# Patient Record
Sex: Female | Born: 1985 | Race: Black or African American | Hispanic: No | Marital: Single | Smoking: Current every day smoker
Health system: Southern US, Community
[De-identification: ages and names within clinical notes are randomized; demographics above are authoritative.]

## PROBLEM LIST (undated history)

## (undated) DIAGNOSIS — I10 Essential (primary) hypertension: Secondary | ICD-10-CM

## (undated) DIAGNOSIS — I890 Lymphedema, not elsewhere classified: Secondary | ICD-10-CM

## (undated) DIAGNOSIS — J45909 Unspecified asthma, uncomplicated: Secondary | ICD-10-CM

## (undated) HISTORY — PX: KIDNEY SURGERY: SHX687

---

## 2006-12-01 ENCOUNTER — Emergency Department: Payer: Self-pay | Admitting: Emergency Medicine

## 2006-12-03 ENCOUNTER — Emergency Department: Payer: Self-pay | Admitting: Emergency Medicine

## 2007-10-26 ENCOUNTER — Emergency Department: Payer: Self-pay | Admitting: Emergency Medicine

## 2007-10-30 ENCOUNTER — Emergency Department: Payer: Self-pay | Admitting: Emergency Medicine

## 2007-11-26 ENCOUNTER — Emergency Department: Payer: Self-pay | Admitting: Emergency Medicine

## 2008-03-31 ENCOUNTER — Emergency Department: Payer: Self-pay | Admitting: Emergency Medicine

## 2008-11-22 ENCOUNTER — Emergency Department (HOSPITAL_COMMUNITY): Admission: EM | Admit: 2008-11-22 | Discharge: 2008-11-23 | Payer: Self-pay | Admitting: Emergency Medicine

## 2009-01-25 ENCOUNTER — Emergency Department: Payer: Self-pay | Admitting: Emergency Medicine

## 2009-03-31 IMAGING — CR DG CHEST 2V
1 series · 2 of 2 positions shown · non-contrast
Comparison: none

REASON FOR EXAM: cough,fever
COMMENTS:

PROCEDURE:     DXR - DXR CHEST PA (OR AP) AND LATERAL  - March 31, 2008 [DATE]
RESULT:     Comparison: No comparison

[Series 1: view not recorded · 0.17mm/px · 2 of 2 slices shown]
[im 1/2]
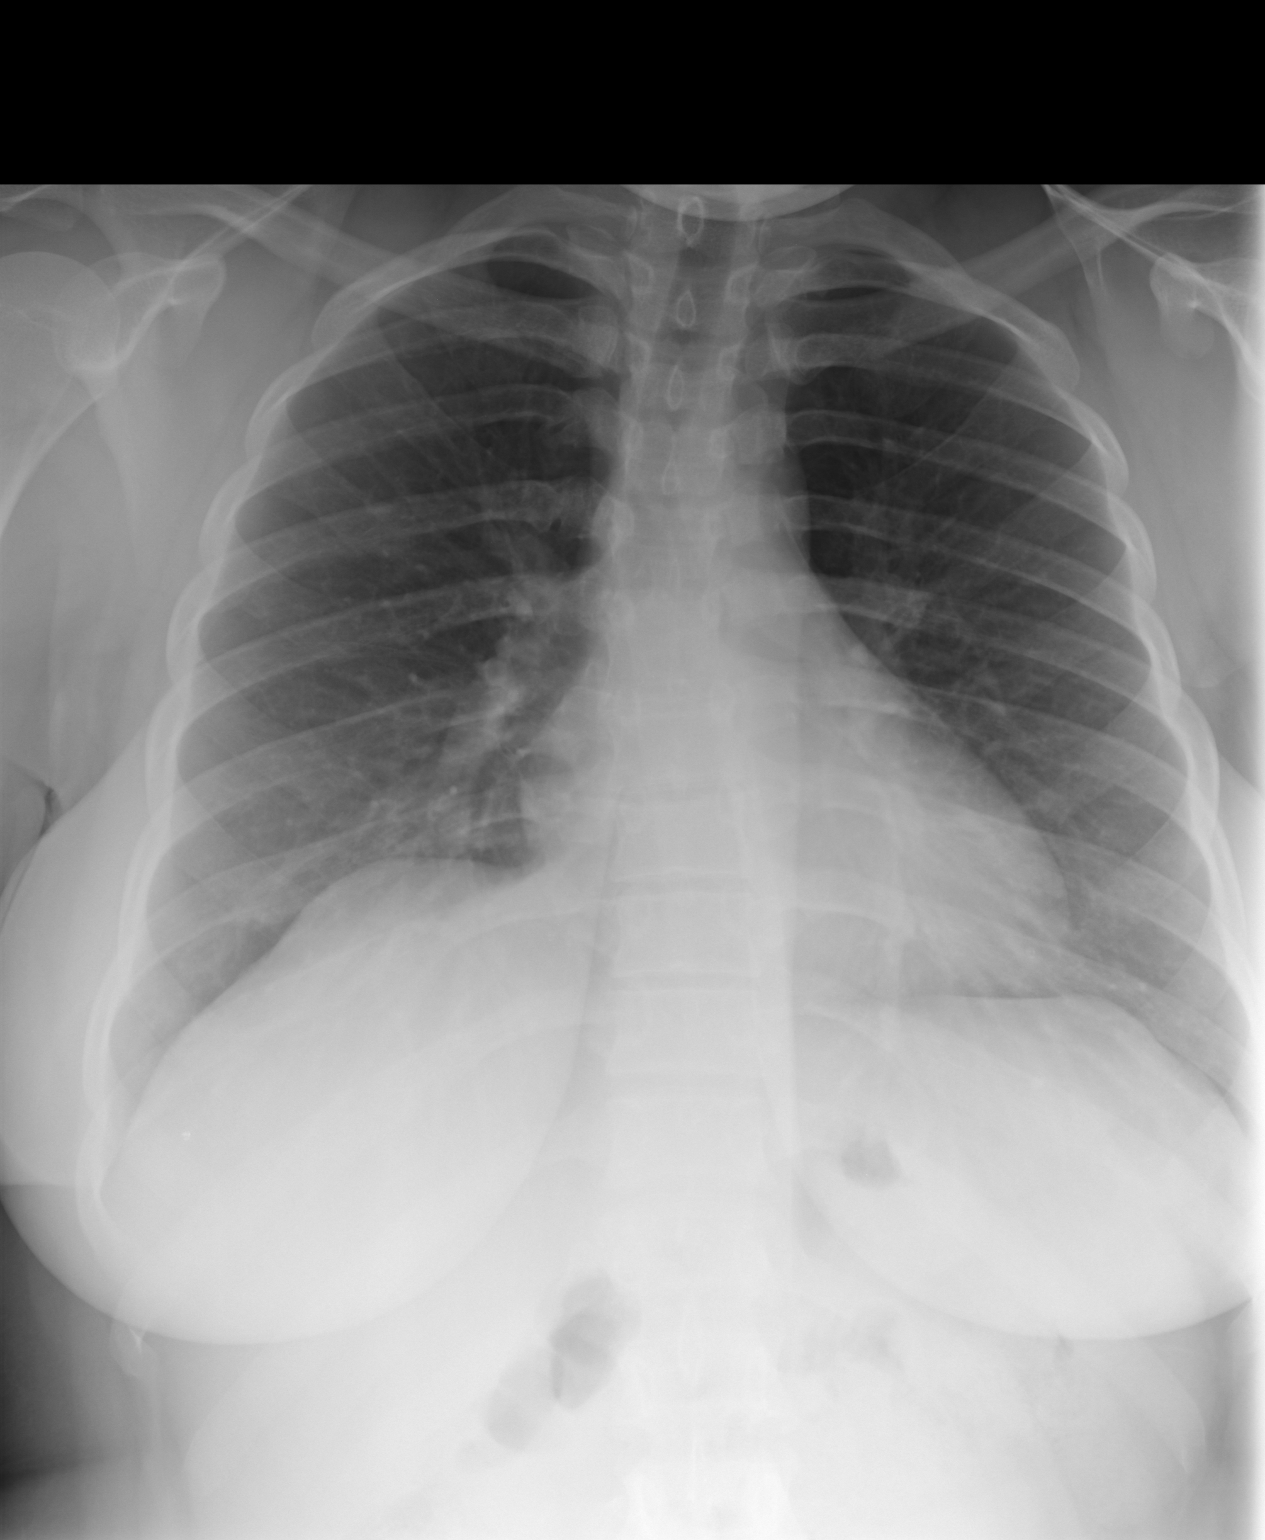
[im 2/2]
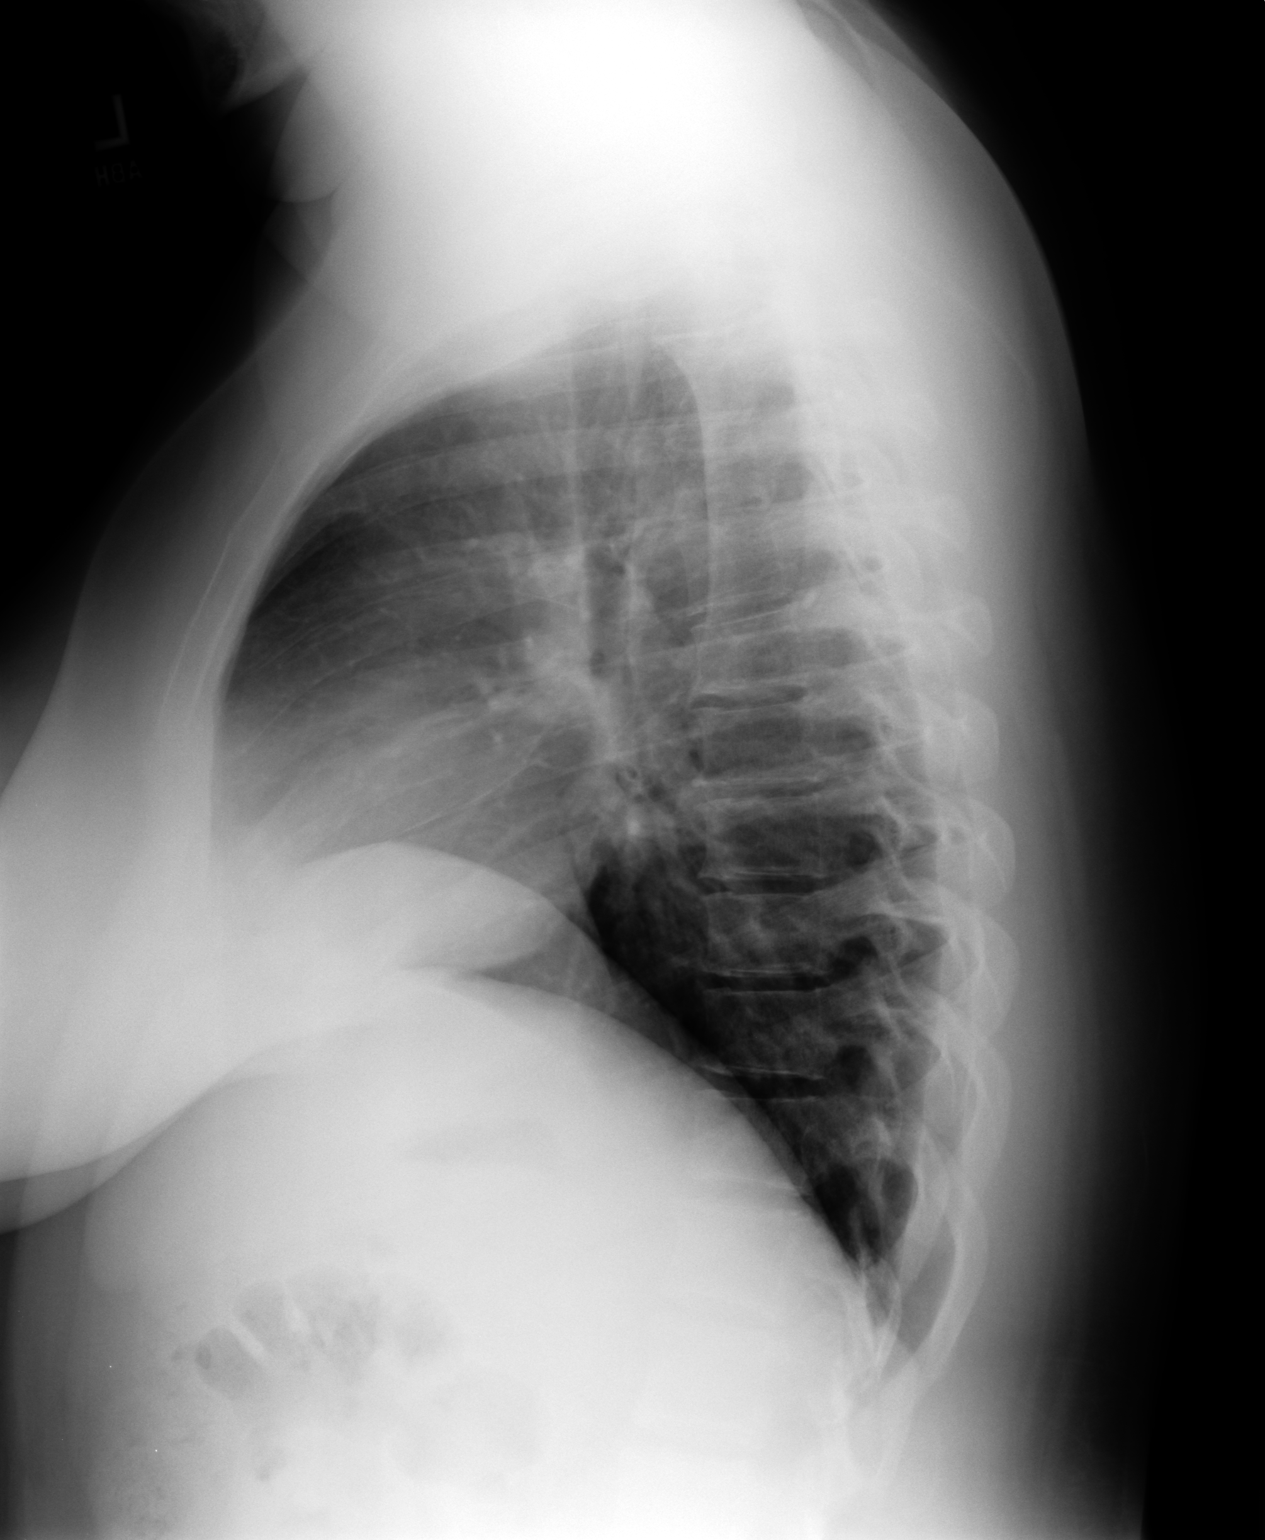

[2 of 2 positions shown; findings below may reference images not displayed]

FINDINGS: PA and lateral chest radiographs are provided. There is no focal parenchymal
opacity, pleural effusion, or pneumothorax. The heart and mediastinum are
unremarkable. The osseous structures are unremarkable.
IMPRESSION: No acute disease of the chest.

## 2009-03-31 IMAGING — CR RIGHT TIBIA AND FIBULA - 2 VIEW
1 series · 2 of 2 positions shown · non-contrast
Comparison: None

REASON FOR EXAM: 3rd episode of cellulitis here this yr
COMMENTS:

PROCEDURE:     DXR - DXR TIBIA AND FIBULA RT (LOWER L  - March 31, 2008 [DATE]
RESULT:     History: Cellulitis

[Series 1: view not recorded · 0.17mm/px · 2 of 2 slices shown]
[im 1/2]
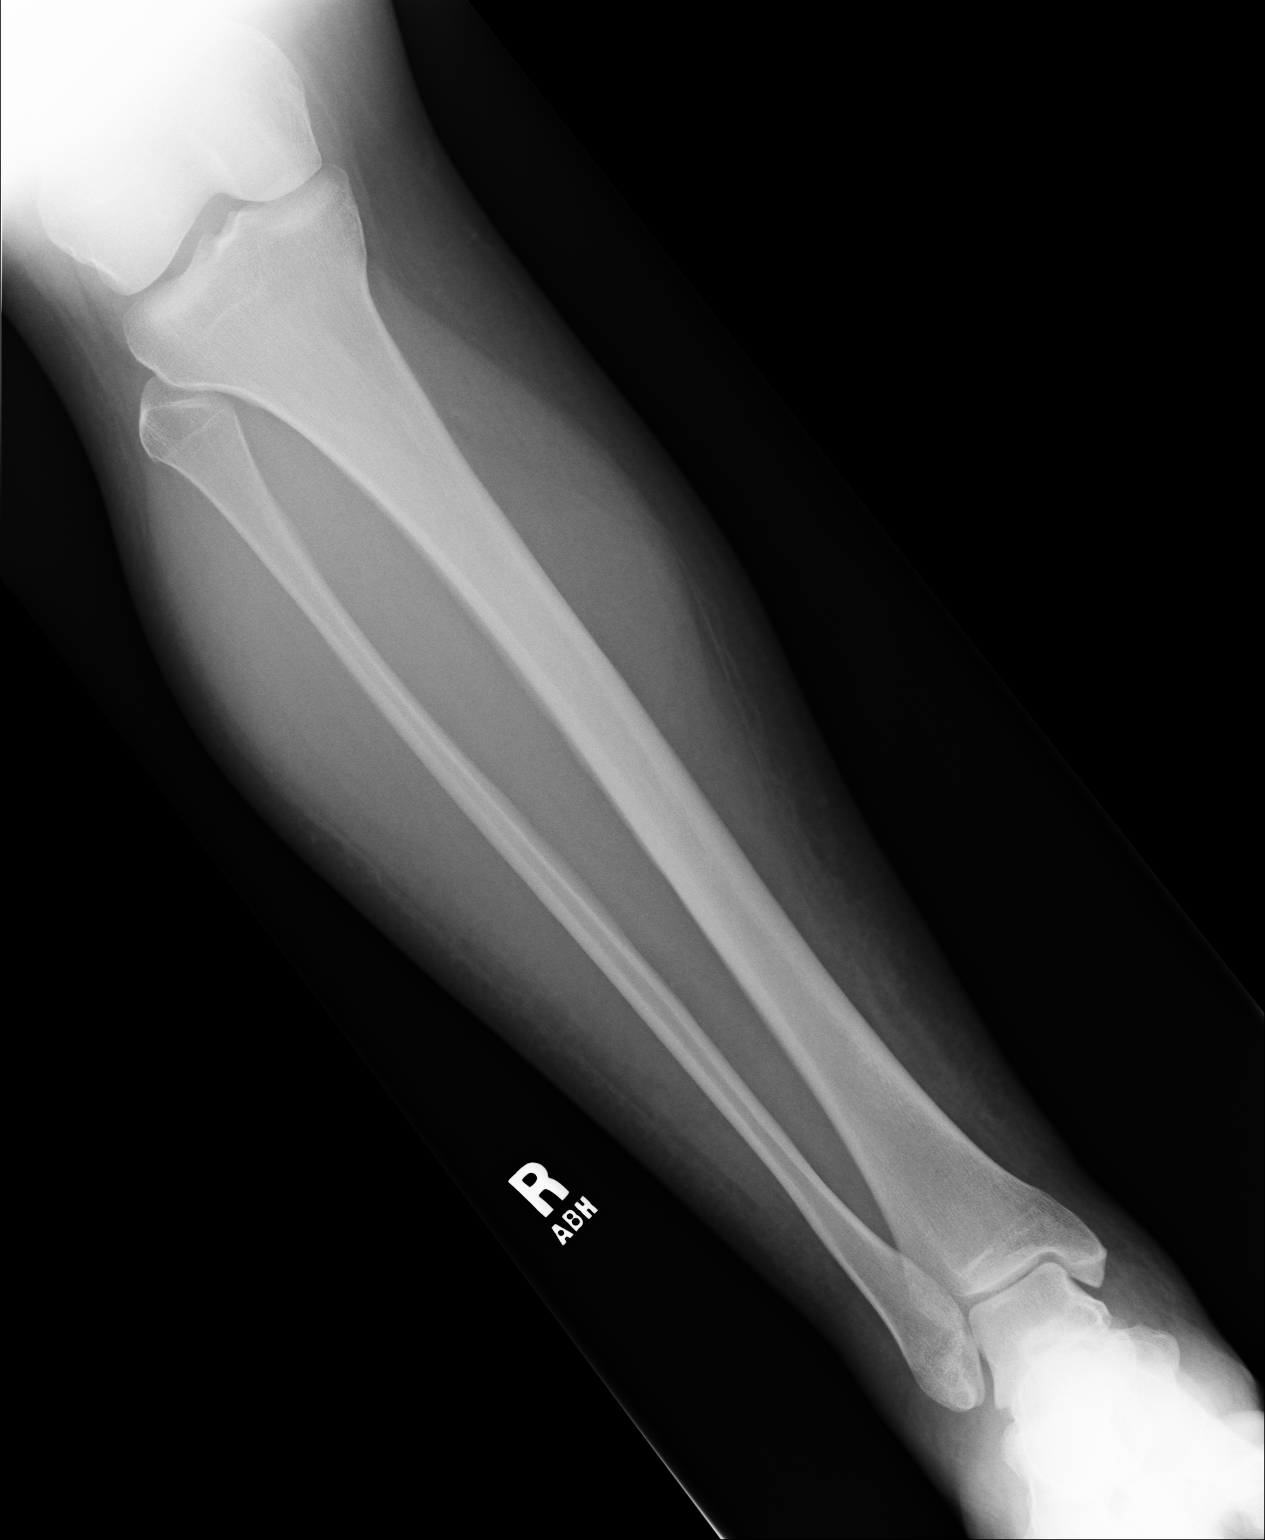
[im 2/2]
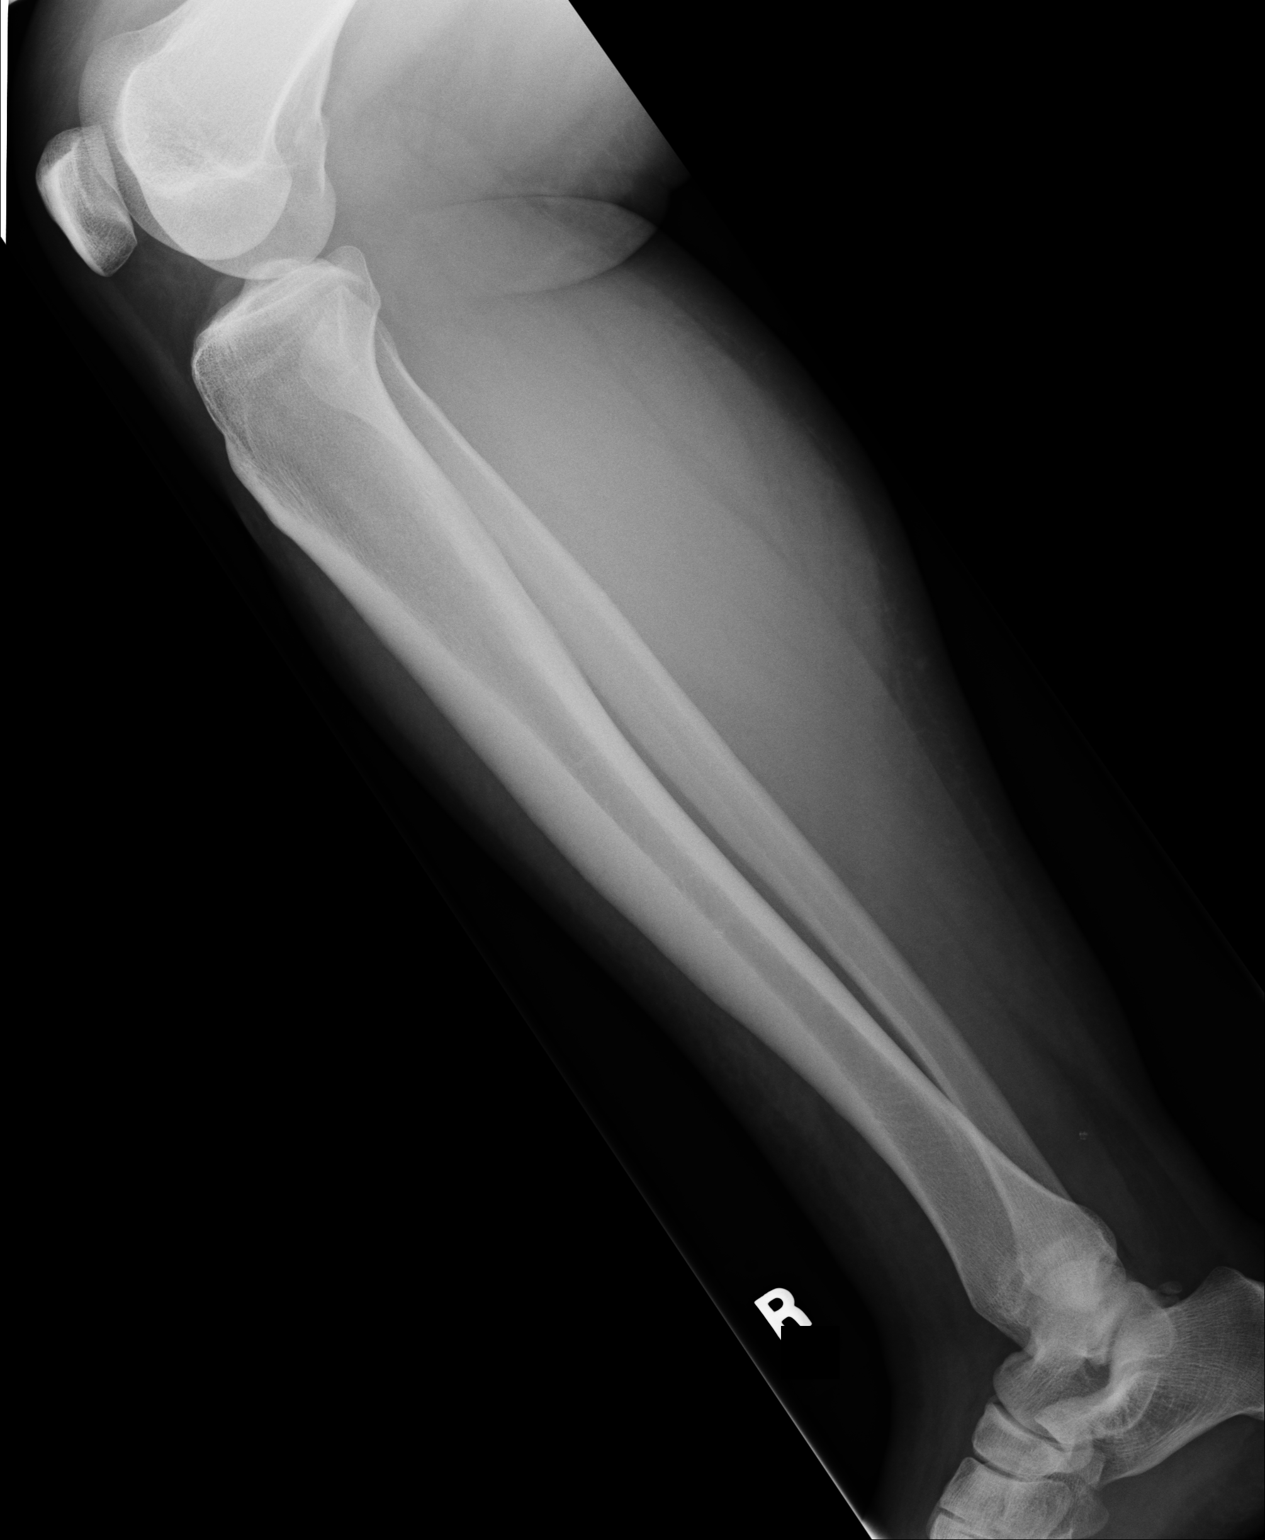

[2 of 2 positions shown; findings below may reference images not displayed]

FINDINGS: Two views of the right tibia and fibula demonstrates no fracture or
dislocation. The soft tissues are unremarkable. There is no subcutaneous
emphysema.
IMPRESSION: No acute osseous injury of the right tibia or fibula.

## 2009-06-19 ENCOUNTER — Emergency Department: Payer: Self-pay | Admitting: Emergency Medicine

## 2009-12-29 ENCOUNTER — Emergency Department: Payer: Self-pay | Admitting: Unknown Physician Specialty

## 2010-01-26 ENCOUNTER — Emergency Department: Payer: Self-pay | Admitting: Emergency Medicine

## 2010-02-02 ENCOUNTER — Emergency Department: Payer: Self-pay | Admitting: Emergency Medicine

## 2010-02-05 ENCOUNTER — Emergency Department: Payer: Self-pay | Admitting: Emergency Medicine

## 2010-02-26 ENCOUNTER — Emergency Department: Payer: Self-pay | Admitting: Emergency Medicine

## 2010-02-27 ENCOUNTER — Emergency Department: Payer: Self-pay | Admitting: Emergency Medicine

## 2010-06-01 ENCOUNTER — Emergency Department: Payer: Self-pay | Admitting: Emergency Medicine

## 2010-10-25 ENCOUNTER — Emergency Department: Payer: Self-pay | Admitting: Emergency Medicine

## 2010-11-02 LAB — CULTURE, BLOOD (ROUTINE X 2)

## 2010-11-02 LAB — URINALYSIS, ROUTINE W REFLEX MICROSCOPIC
Leukocytes, UA: NEGATIVE
Nitrite: NEGATIVE
Protein, ur: NEGATIVE mg/dL
Urobilinogen, UA: 1 mg/dL (ref 0.0–1.0)

## 2010-11-02 LAB — URINE MICROSCOPIC-ADD ON

## 2011-01-13 ENCOUNTER — Emergency Department: Payer: Self-pay | Admitting: Unknown Physician Specialty

## 2011-02-25 ENCOUNTER — Emergency Department: Payer: Self-pay | Admitting: Emergency Medicine

## 2011-02-27 ENCOUNTER — Emergency Department: Payer: Self-pay | Admitting: Emergency Medicine

## 2011-03-01 ENCOUNTER — Inpatient Hospital Stay: Payer: Self-pay | Admitting: Internal Medicine

## 2011-05-17 ENCOUNTER — Emergency Department: Payer: Self-pay | Admitting: Emergency Medicine

## 2011-08-17 ENCOUNTER — Emergency Department: Payer: Self-pay | Admitting: Emergency Medicine

## 2011-09-13 ENCOUNTER — Emergency Department: Payer: Self-pay | Admitting: Emergency Medicine

## 2011-10-23 ENCOUNTER — Emergency Department: Payer: Self-pay | Admitting: Emergency Medicine

## 2011-10-23 LAB — CBC
HCT: 38.3 % (ref 35.0–47.0)
HGB: 13.2 g/dL (ref 12.0–16.0)
MCHC: 34.5 g/dL (ref 32.0–36.0)
MCV: 90 fL (ref 80–100)
RBC: 4.24 10*6/uL (ref 3.80–5.20)
RDW: 12.9 % (ref 11.5–14.5)

## 2012-03-31 ENCOUNTER — Emergency Department: Payer: Self-pay | Admitting: Emergency Medicine

## 2012-08-16 ENCOUNTER — Emergency Department: Payer: Self-pay | Admitting: Emergency Medicine

## 2012-08-24 ENCOUNTER — Emergency Department: Payer: Self-pay | Admitting: Emergency Medicine

## 2012-08-24 LAB — BASIC METABOLIC PANEL
Anion Gap: 9 (ref 7–16)
BUN: 9 mg/dL (ref 7–18)
Calcium, Total: 9 mg/dL (ref 8.5–10.1)
Chloride: 104 mmol/L (ref 98–107)
EGFR (African American): >60
Osmolality: 272 (ref 275–301)
Potassium: 4 mmol/L (ref 3.5–5.1)

## 2012-08-24 LAB — CBC
HGB: 12.4 g/dL (ref 12.0–16.0)
MCH: 29.9 pg (ref 26.0–34.0)
MCHC: 33.5 g/dL (ref 32.0–36.0)
MCV: 89 fL (ref 80–100)
Platelet: 272 10*3/uL (ref 150–440)
RDW: 13.1 % (ref 11.5–14.5)
WBC: 13.8 10*3/uL — ABNORMAL HIGH (ref 3.6–11.0)

## 2012-09-20 ENCOUNTER — Emergency Department: Payer: Self-pay | Admitting: Emergency Medicine

## 2012-09-21 LAB — CBC WITH DIFFERENTIAL/PLATELET
Basophil %: 0.2 %
Eosinophil #: 0.1 10*3/uL (ref 0.0–0.7)
Eosinophil %: 0.7 %
HGB: 13.1 g/dL (ref 12.0–16.0)
Lymphocyte #: 1 10*3/uL (ref 1.0–3.6)
MCHC: 32.7 g/dL (ref 32.0–36.0)
MCV: 88 fL (ref 80–100)
Neutrophil %: 89.1 %
RDW: 13.6 % (ref 11.5–14.5)
WBC: 18.8 10*3/uL — ABNORMAL HIGH (ref 3.6–11.0)

## 2012-09-21 LAB — URINALYSIS, COMPLETE
Bacteria: NONE SEEN
Blood: NEGATIVE
Glucose,UR: NEGATIVE mg/dL (ref 0–75)
Ketone: NEGATIVE
Ph: 6 (ref 4.5–8.0)
Protein: NEGATIVE
Squamous Epithelial: 1
WBC UR: 2 /HPF (ref 0–5)

## 2012-09-21 LAB — BASIC METABOLIC PANEL
Anion Gap: 6 — ABNORMAL LOW (ref 7–16)
BUN: 14 mg/dL (ref 7–18)
Calcium, Total: 9.1 mg/dL (ref 8.5–10.1)
Chloride: 106 mmol/L (ref 98–107)
Co2: 27 mmol/L (ref 21–32)
EGFR (Non-African Amer.): 53 — ABNORMAL LOW
Glucose: 98 mg/dL (ref 65–99)
Sodium: 139 mmol/L (ref 136–145)

## 2012-09-21 LAB — WET PREP, GENITAL

## 2012-09-23 ENCOUNTER — Inpatient Hospital Stay: Payer: Self-pay | Admitting: Student

## 2012-09-23 LAB — CBC
HCT: 37 % (ref 35.0–47.0)
MCH: 29.6 pg (ref 26.0–34.0)
MCHC: 33.7 g/dL (ref 32.0–36.0)
Platelet: 175 10*3/uL (ref 150–440)
RDW: 14 % (ref 11.5–14.5)
WBC: 11.4 10*3/uL — ABNORMAL HIGH (ref 3.6–11.0)

## 2012-09-23 LAB — BASIC METABOLIC PANEL
EGFR (Non-African Amer.): 42 — ABNORMAL LOW
Osmolality: 275 (ref 275–301)

## 2012-09-23 LAB — HEMOGLOBIN A1C: Hemoglobin A1C: 5.5 % (ref 4.2–6.3)

## 2012-09-24 LAB — CBC WITH DIFFERENTIAL/PLATELET
Basophil #: 0 10*3/uL (ref 0.0–0.1)
Eosinophil %: 0.9 %
HCT: 33.9 % — ABNORMAL LOW (ref 35.0–47.0)
HGB: 11.8 g/dL — ABNORMAL LOW (ref 12.0–16.0)
Lymphocyte #: 1.2 10*3/uL (ref 1.0–3.6)
Lymphocyte %: 16 %
MCH: 31 pg (ref 26.0–34.0)
MCHC: 35 g/dL (ref 32.0–36.0)
Monocyte #: 0.7 x10 3/mm (ref 0.2–0.9)
Monocyte %: 9.9 %
Neutrophil #: 5.4 10*3/uL (ref 1.4–6.5)
Neutrophil %: 73 %
RBC: 3.82 10*6/uL (ref 3.80–5.20)
RDW: 13.9 % (ref 11.5–14.5)
WBC: 7.4 10*3/uL (ref 3.6–11.0)

## 2012-09-24 LAB — BASIC METABOLIC PANEL
BUN: 14 mg/dL (ref 7–18)
Chloride: 110 mmol/L — ABNORMAL HIGH (ref 98–107)
Co2: 25 mmol/L (ref 21–32)
EGFR (Non-African Amer.): 56 — ABNORMAL LOW
Osmolality: 275 (ref 275–301)
Sodium: 138 mmol/L (ref 136–145)

## 2012-09-24 LAB — VANCOMYCIN, RANDOM: Vancomycin, Random: 15 ug/mL

## 2012-09-25 LAB — BASIC METABOLIC PANEL
Anion Gap: 4 — ABNORMAL LOW (ref 7–16)
BUN: 7 mg/dL (ref 7–18)
Creatinine: 1.21 mg/dL (ref 0.60–1.30)
EGFR (African American): >60
EGFR (Non-African Amer.): >60
Glucose: 82 mg/dL (ref 65–99)
Potassium: 3.7 mmol/L (ref 3.5–5.1)

## 2012-09-26 LAB — CULTURE, BLOOD (SINGLE)

## 2012-09-29 LAB — CULTURE, BLOOD (SINGLE)

## 2012-10-02 ENCOUNTER — Emergency Department: Payer: Self-pay | Admitting: Emergency Medicine

## 2012-10-02 LAB — URINALYSIS, COMPLETE
Ketone: NEGATIVE
Nitrite: NEGATIVE
Protein: NEGATIVE
Specific Gravity: 1.016 (ref 1.003–1.030)
Squamous Epithelial: 30
WBC UR: 12 /HPF (ref 0–5)

## 2012-10-02 LAB — COMPREHENSIVE METABOLIC PANEL
Anion Gap: 6 — ABNORMAL LOW (ref 7–16)
BUN: 12 mg/dL (ref 7–18)
Bilirubin,Total: 0.4 mg/dL (ref 0.2–1.0)
Creatinine: 1.17 mg/dL (ref 0.60–1.30)
EGFR (Non-African Amer.): >60
Glucose: 87 mg/dL (ref 65–99)
Osmolality: 280 (ref 275–301)
Potassium: 3.7 mmol/L (ref 3.5–5.1)

## 2012-10-02 LAB — CBC
MCHC: 33.9 g/dL (ref 32.0–36.0)
Platelet: 568 10*3/uL — ABNORMAL HIGH (ref 150–440)
RDW: 14 % (ref 11.5–14.5)

## 2012-10-05 ENCOUNTER — Emergency Department: Payer: Self-pay | Admitting: Emergency Medicine

## 2012-12-05 ENCOUNTER — Emergency Department: Payer: Self-pay | Admitting: Internal Medicine

## 2012-12-05 LAB — BASIC METABOLIC PANEL
Anion Gap: 2 — ABNORMAL LOW (ref 7–16)
BUN: 10 mg/dL (ref 7–18)
Creatinine: 1.13 mg/dL (ref 0.60–1.30)
EGFR (Non-African Amer.): >60
Osmolality: 277 (ref 275–301)
Sodium: 139 mmol/L (ref 136–145)

## 2012-12-05 LAB — CBC
HCT: 36.3 % (ref 35.0–47.0)
HGB: 12.3 g/dL (ref 12.0–16.0)
MCH: 30.1 pg (ref 26.0–34.0)
RBC: 4.1 10*6/uL (ref 3.80–5.20)
RDW: 13.1 % (ref 11.5–14.5)
WBC: 9 10*3/uL (ref 3.6–11.0)

## 2012-12-24 ENCOUNTER — Emergency Department: Payer: Self-pay | Admitting: Emergency Medicine

## 2012-12-24 LAB — PRO B NATRIURETIC PEPTIDE: B-Type Natriuretic Peptide: 32 pg/mL (ref 0–125)

## 2012-12-24 LAB — BASIC METABOLIC PANEL
Calcium, Total: 9.1 mg/dL (ref 8.5–10.1)
Chloride: 106 mmol/L (ref 98–107)
EGFR (African American): >60
EGFR (Non-African Amer.): >60
Glucose: 90 mg/dL (ref 65–99)
Osmolality: 272 (ref 275–301)
Potassium: 3.5 mmol/L (ref 3.5–5.1)

## 2012-12-24 LAB — CBC
HCT: 39.5 % (ref 35.0–47.0)
MCH: 30.8 pg (ref 26.0–34.0)
MCHC: 35.1 g/dL (ref 32.0–36.0)
MCV: 88 fL (ref 80–100)
Platelet: 281 10*3/uL (ref 150–440)
RBC: 4.51 10*6/uL (ref 3.80–5.20)
RDW: 13.1 % (ref 11.5–14.5)
WBC: 9.9 10*3/uL (ref 3.6–11.0)

## 2013-02-01 ENCOUNTER — Emergency Department: Payer: Self-pay | Admitting: Emergency Medicine

## 2013-02-15 ENCOUNTER — Encounter (HOSPITAL_COMMUNITY): Payer: Self-pay | Admitting: *Deleted

## 2013-02-15 ENCOUNTER — Emergency Department (HOSPITAL_COMMUNITY)
Admission: EM | Admit: 2013-02-15 | Discharge: 2013-02-16 | Disposition: A | Discharge status: Home / Self Care | Payer: Self-pay | Attending: Emergency Medicine | Admitting: Emergency Medicine

## 2013-02-15 DIAGNOSIS — Z880 Allergy status to penicillin: Secondary | ICD-10-CM | POA: Insufficient documentation

## 2013-02-15 DIAGNOSIS — I890 Lymphedema, not elsewhere classified: Secondary | ICD-10-CM | POA: Insufficient documentation

## 2013-02-15 DIAGNOSIS — F17200 Nicotine dependence, unspecified, uncomplicated: Secondary | ICD-10-CM | POA: Insufficient documentation

## 2013-02-15 DIAGNOSIS — R609 Edema, unspecified: Secondary | ICD-10-CM | POA: Insufficient documentation

## 2013-02-15 DIAGNOSIS — M2550 Pain in unspecified joint: Secondary | ICD-10-CM | POA: Insufficient documentation

## 2013-02-15 MED ORDER — HYDROCODONE-ACETAMINOPHEN 5-325 MG PO TABS
2.0000 | ORAL_TABLET | Freq: Once | ORAL | Status: AC
  Administered 2013-02-15: 2 via ORAL
  Filled 2013-02-15: qty 2

## 2013-02-15 NOTE — ED Notes (Signed)
Edema 2+ present to right lower extremity, thready pulse felt to right foot, foot warm to touch, ambulatory in department.

## 2013-02-15 NOTE — ED Provider Notes (Signed)
CSN: [id number]     Arrival date & time 02/15/13  1832 History     First MD Initiated Contact with Patient 02/15/13 2253     Chief Complaint  Patient presents with  . Leg Swelling   (Consider location/radiation/quality/duration/timing/severity/associated sxs/prior Treatment) HPI  Heather Johnston is a 27 y.o. female  with a hx of cellulitis presents to the Emergency Department complaining of gradual, persistent, progressively worsening swelling in the RLE beginning 6 months ago.  Pt states long hx of cellulitis with a particularly bad episode on Feb 2014 for which she was seen at [facility] and admitted.  During that time she was found to be septic and was given a large amount of fluid.  The Right leg was the infected and it became very swollen at that time and has persisted.  She reports several venous duplex scans that have ben negative and [facility] referred her to [place] to see a specialist but she has not been.  Associated symptoms include pain in the ankle.  Nothing makes it better and nothing makes it worse.  Pt states she has tried compression hose and, HCTZ, lasix have been helping.  Pt has also been taking ibuprofen but this is not helping with the pain.  Pt denies fever, chills, headache, neck pain, chest pain, shortness of breath, abdominal pain, nausea, vomiting, diarrhea, weakness, dizziness, syncope, redness or evidence of infection in the leg.     History reviewed. No pertinent past medical history. History reviewed. No pertinent past surgical history. History reviewed. No pertinent family history. History  Substance Use Topics  . Smoking status: Current Every Day Smoker    Types: Cigarettes  . Smokeless tobacco: Not on file  . Alcohol Use: Yes     Comment: occ   OB History   Grav Para Term Preterm Abortions TAB SAB Ect Mult Living                 Review of Systems  Constitutional: Negative for fever, diaphoresis, appetite change, fatigue and unexpected weight change.  HENT:  Negative for mouth sores and neck stiffness.   Eyes: Negative for visual disturbance.  Respiratory: Negative for cough, chest tightness, shortness of breath and wheezing.   Cardiovascular: Positive for leg swelling (unilateral, right). Negative for chest pain.  Gastrointestinal: Negative for nausea, vomiting, abdominal pain, diarrhea and constipation.  Endocrine: Negative for polydipsia, polyphagia and polyuria.  Genitourinary: Negative for dysuria, urgency, frequency and hematuria.  Musculoskeletal: Positive for arthralgias (Right ankle). Negative for back pain.  Skin: Negative for rash.  Allergic/Immunologic: Negative for immunocompromised state.  Neurological: Negative for syncope, light-headedness and headaches.  Hematological: Does not bruise/bleed easily.  Psychiatric/Behavioral: Negative for sleep disturbance. The patient is not nervous/anxious.     Allergies  Bactrim and Penicillins  Home Medications   Current Outpatient Rx  Name  Route  Sig  Dispense  Refill  . furosemide (LASIX) 20 MG tablet   Oral   Take 10-20 mg by mouth daily as needed for fluid.          BP 129/86  Pulse 83  Temp(Src) 98.9 F (37.2 C) (Oral)  Resp 16  SpO2 99%  LMP 01/24/2013 Physical Exam  Nursing note and vitals reviewed. Constitutional: She is oriented to person, place, and time. She appears well-developed and well-nourished. No distress.  Awake, alert, nontoxic appearance  HENT:  Head: Normocephalic and atraumatic.  Mouth/Throat: Oropharynx is clear and moist. No oropharyngeal exudate.  Eyes: Conjunctivae are normal. No scleral  icterus.  Neck: Normal range of motion. Neck supple.  Cardiovascular: Normal rate, regular rhythm, S1 normal, S2 normal, normal heart sounds and intact distal pulses.   No murmur heard. Pulses:      Radial pulses are 2+ on the right side, and 2+ on the left side.       Dorsalis pedis pulses are 2+ on the left side.       Posterior tibial pulses are 2+ on the  left side.  DP and PT pulses nonpalpable on the right lower extremity secondary to edema Capillary refill less than 3 seconds in bilateral lower extremities  Pulmonary/Chest: Effort normal and breath sounds normal. No respiratory distress. She has no wheezes. She has no rales. She exhibits no tenderness.  Abdominal: Soft. Bowel sounds are normal. She exhibits no mass. There is no tenderness. There is no rebound and no guarding.  Musculoskeletal: Normal range of motion. She exhibits edema and tenderness.  2+ Mildly pitting edema of the right lower extremity Palpation of the calf, ankle and foot  Lymphadenopathy:    She has no cervical adenopathy.  Neurological: She is alert and oriented to person, place, and time. She exhibits normal muscle tone. Coordination normal.  Speech is clear and goal oriented Moves extremities without ataxia  Skin: Skin is warm and dry. No rash noted. She is not diaphoretic. No erythema.   No erythema, induration or streaking to indicate cellulitis  Psychiatric: She has a normal mood and affect.    ED Course   Procedures (including critical care time)  Labs Reviewed  COMPREHENSIVE METABOLIC PANEL - Abnormal; Notable for the following:    Glucose, Bld 107 (*)    Creatinine, Ser 1.14 (*)    GFR calc non Af Amer 66 (*)    GFR calc Af Amer 76 (*)    All other components within normal limits  CBC  PRO B NATRIURETIC PEPTIDE  D-DIMER, QUANTITATIVE   Dg Chest 2 View  02/16/2013   *RADIOLOGY REPORT*  Clinical Data: Right lower extremity swelling since March.  Smoker.  CHEST - 2 VIEW  Comparison: 11/22/2008  Findings: Mild elevation of the right hemidiaphragm. The heart size and pulmonary vascularity are normal. The lungs appear clear and expanded without focal air space disease or consolidation. No blunting of the costophrenic angles.  No pneumothorax.  Mediastinal contours appear intact.  No significant changes since previous study.  Degenerative changes in the  thoracic spine.  IMPRESSION: No evidence of active pulmonary disease.   Original Report Authenticated By: Burman Nieves, M.D.   1. Lymphedema   2. Peripheral edema     MDM  Heather Johnston is no peripheral edema persistent since February 2014.  With increased pain in the leg this week.  2+ mildly pitting edema on exam, mild calf tenderness, no palpable cord. Patient with unremarkable CBC and BMP. CMP with mild elevation of serum creatinine to 1.14. Chest x-ray without evidence of pulmonary edema.  D-dimer negative.  Peripheral edema not likely due to blood clot secondary to reported negative venous duplex on multiple occasions and negative d-dimer today.  Unsure of etiology of edema.  Instructed patient to continue taking Lasix 20mg  qd and followup with PCP and vascular surgery. Pain controlled in ED.  I have also discussed reasons to return immediately to the ER.  Patient expresses understanding and agrees with plan.      Dahlia Client Nikki Rusnak, PA-C 02/16/13 [phone]

## 2013-02-15 NOTE — ED Notes (Signed)
Pt reports swelling to right leg, has hx of same and has been seen by pcp for it and r/o dvt. Pt reports increase in swelling and pain to right leg recently. Moderate edema noted.

## 2013-02-16 ENCOUNTER — Emergency Department (HOSPITAL_COMMUNITY): Payer: Self-pay

## 2013-02-16 LAB — COMPREHENSIVE METABOLIC PANEL
ALT: 18 U/L (ref 0–35)
Alkaline Phosphatase: 54 U/L (ref 39–117)
CO2: 27 mEq/L (ref 19–32)
GFR calc Af Amer: 76 mL/min — ABNORMAL LOW (ref >90)
Glucose, Bld: 107 mg/dL — ABNORMAL HIGH (ref 70–99)
Potassium: 3.8 mEq/L (ref 3.5–5.1)
Sodium: 137 mEq/L (ref 135–145)
Total Protein: 7.4 g/dL (ref 6.0–8.3)

## 2013-02-16 LAB — CBC
HCT: 36.9 % (ref 36.0–46.0)
MCHC: 36 g/dL (ref 30.0–36.0)
RDW: 12.9 % (ref 11.5–15.5)

## 2013-02-16 LAB — D-DIMER, QUANTITATIVE: D-Dimer, Quant: 0.46 ug/mL-FEU (ref 0.00–0.48)

## 2013-02-16 MED ORDER — OXYCODONE-ACETAMINOPHEN 5-325 MG PO TABS: 1.0000 | ORAL_TABLET | ORAL | Status: DC | PRN

## 2013-02-16 NOTE — ED Provider Notes (Signed)
Medical screening examination/treatment/procedure(s) were performed by non-physician practitioner and as supervising physician I was immediately available for consultation/collaboration.  John-Adam Tyce Delcid, M.D.     John-Adam Aysha Livecchi, MD 02/16/13 0641 

## 2013-02-16 NOTE — ED Notes (Signed)
Patient transported to X-ray 

## 2013-05-22 ENCOUNTER — Emergency Department: Payer: Self-pay | Admitting: Emergency Medicine

## 2013-07-09 ENCOUNTER — Emergency Department: Payer: Self-pay | Admitting: Emergency Medicine

## 2013-07-11 ENCOUNTER — Emergency Department: Payer: Self-pay | Admitting: Emergency Medicine

## 2013-09-22 ENCOUNTER — Emergency Department: Payer: Self-pay | Admitting: Emergency Medicine

## 2013-09-22 LAB — URINALYSIS, COMPLETE
Bilirubin,UR: NEGATIVE
Glucose,UR: NEGATIVE mg/dL (ref 0–75)
KETONE: NEGATIVE
Nitrite: NEGATIVE
PH: 6 (ref 4.5–8.0)
Protein: 30
RBC,UR: 50 /HPF (ref 0–5)
Specific Gravity: 1.018 (ref 1.003–1.030)
Squamous Epithelial: 3
WBC UR: 36 /HPF (ref 0–5)

## 2013-09-22 LAB — WET PREP, GENITAL

## 2013-09-22 LAB — GC/CHLAMYDIA PROBE AMP

## 2013-09-23 LAB — URINE CULTURE

## 2013-09-29 LAB — WOUND CULTURE

## 2013-11-29 ENCOUNTER — Emergency Department: Payer: Self-pay | Admitting: Emergency Medicine

## 2013-12-09 ENCOUNTER — Emergency Department: Payer: Self-pay | Admitting: Emergency Medicine

## 2013-12-09 LAB — BASIC METABOLIC PANEL
ANION GAP: 5 — AB (ref 7–16)
BUN: 15 mg/dL (ref 7–18)
CREATININE: 1.1 mg/dL (ref 0.60–1.30)
Calcium, Total: 9.6 mg/dL (ref 8.5–10.1)
Chloride: 106 mmol/L (ref 98–107)
Co2: 27 mmol/L (ref 21–32)
EGFR (African American): >60
EGFR (Non-African Amer.): >60
GLUCOSE: 88 mg/dL (ref 65–99)
OSMOLALITY: 276 (ref 275–301)
Potassium: 3.8 mmol/L (ref 3.5–5.1)
Sodium: 138 mmol/L (ref 136–145)

## 2013-12-09 LAB — APTT: ACTIVATED PTT: 32.2 s (ref 23.6–35.9)

## 2013-12-09 LAB — CBC
HCT: 40.7 % (ref 35.0–47.0)
HGB: 13.7 g/dL (ref 12.0–16.0)
MCH: 30.7 pg (ref 26.0–34.0)
MCHC: 33.7 g/dL (ref 32.0–36.0)
MCV: 91 fL (ref 80–100)
Platelet: 291 10*3/uL (ref 150–440)
RBC: 4.47 10*6/uL (ref 3.80–5.20)
RDW: 13.2 % (ref 11.5–14.5)
WBC: 10.5 10*3/uL (ref 3.6–11.0)

## 2013-12-09 LAB — PROTIME-INR
INR: 1
PROTHROMBIN TIME: 12.6 s (ref 11.5–14.7)

## 2014-01-27 ENCOUNTER — Emergency Department (HOSPITAL_COMMUNITY)
Admission: EM | Admit: 2014-01-27 | Discharge: 2014-01-27 | Disposition: A | Discharge status: Home / Self Care | Payer: Self-pay | Attending: Emergency Medicine | Admitting: Emergency Medicine

## 2014-01-27 ENCOUNTER — Emergency Department (HOSPITAL_COMMUNITY): Payer: Self-pay

## 2014-01-27 ENCOUNTER — Encounter (HOSPITAL_COMMUNITY): Payer: Self-pay | Admitting: Emergency Medicine

## 2014-01-27 DIAGNOSIS — F17200 Nicotine dependence, unspecified, uncomplicated: Secondary | ICD-10-CM | POA: Insufficient documentation

## 2014-01-27 DIAGNOSIS — R609 Edema, unspecified: Secondary | ICD-10-CM | POA: Insufficient documentation

## 2014-01-27 DIAGNOSIS — Z880 Allergy status to penicillin: Secondary | ICD-10-CM | POA: Insufficient documentation

## 2014-01-27 HISTORY — DX: Lymphedema, not elsewhere classified: I89.0

## 2014-01-27 LAB — CBC WITH DIFFERENTIAL/PLATELET
Basophils Absolute: 0 10*3/uL (ref 0.0–0.1)
Basophils Relative: 0 % (ref 0–1)
Eosinophils Absolute: 0.2 10*3/uL (ref 0.0–0.7)
Eosinophils Relative: 3 % (ref 0–5)
HCT: 37.8 % (ref 36.0–46.0)
Hemoglobin: 13.3 g/dL (ref 12.0–15.0)
Lymphocytes Relative: 37 % (ref 12–46)
Lymphs Abs: 3.1 10*3/uL (ref 0.7–4.0)
MCH: 30.9 pg (ref 26.0–34.0)
MCHC: 35.2 g/dL (ref 30.0–36.0)
MCV: 87.9 fL (ref 78.0–100.0)
Monocytes Absolute: 0.5 10*3/uL (ref 0.1–1.0)
Monocytes Relative: 6 % (ref 3–12)
Neutro Abs: 4.7 10*3/uL (ref 1.7–7.7)
Neutrophils Relative %: 54 % (ref 43–77)
Platelets: 320 10*3/uL (ref 150–400)
RBC: 4.3 MIL/uL (ref 3.87–5.11)
RDW: 13 % (ref 11.5–15.5)
WBC: 8.6 10*3/uL (ref 4.0–10.5)

## 2014-01-27 LAB — BASIC METABOLIC PANEL
Anion gap: 12 (ref 5–15)
BUN: 12 mg/dL (ref 6–23)
CHLORIDE: 103 meq/L (ref 96–112)
CO2: 26 meq/L (ref 19–32)
Calcium: 9.5 mg/dL (ref 8.4–10.5)
Creatinine, Ser: 1.12 mg/dL — ABNORMAL HIGH (ref 0.50–1.10)
GFR calc Af Amer: 77 mL/min — ABNORMAL LOW (ref >90)
GFR, EST NON AFRICAN AMERICAN: 67 mL/min — AB (ref >90)
GLUCOSE: 102 mg/dL — AB (ref 70–99)
POTASSIUM: 3.9 meq/L (ref 3.7–5.3)
SODIUM: 141 meq/L (ref 137–147)

## 2014-01-27 MED ORDER — OXYCODONE-ACETAMINOPHEN 5-325 MG PO TABS
1.0000 | ORAL_TABLET | Freq: Once | ORAL | Status: AC
  Administered 2014-01-27: 1 via ORAL

## 2014-01-27 MED ORDER — OXYCODONE-ACETAMINOPHEN 5-325 MG PO TABS
ORAL_TABLET | ORAL | Status: AC
Start: 2014-01-27 — End: 2014-01-27
  Administered 2014-01-27: 1 via ORAL
  Filled 2014-01-27: qty 1

## 2014-01-27 MED ORDER — HYDROCODONE-ACETAMINOPHEN 5-325 MG PO TABS: 1.0000 | ORAL_TABLET | Freq: Four times a day (QID) | ORAL | Status: DC | PRN

## 2014-01-27 MED ORDER — OXYCODONE-ACETAMINOPHEN 5-325 MG PO TABS
ORAL_TABLET | ORAL | Status: AC
  Administered 2014-01-27: 1
  Filled 2014-01-27: qty 1

## 2014-01-27 MED ORDER — CEPHALEXIN 500 MG PO CAPS: 500.0000 mg | ORAL_CAPSULE | Freq: Four times a day (QID) | ORAL | Status: DC

## 2014-01-27 NOTE — ED Notes (Signed)
Swelling of both lower legs. Dx with lymphedema in rt lower leg. Painful

## 2014-01-27 NOTE — Discharge Instructions (Signed)
Follow up next week.  Increase your lasix to 2 pills a day for 1 week.

## 2014-01-27 NOTE — ED Provider Notes (Signed)
CSN: [id number]     Arrival date & time 01/27/14  1604 History  This chart was scribed for Heather LennertJoseph L Earlin Sweeden, MD by Charline BillsEssence Howell, ED Scribe. The patient was seen in room APA18/APA18. Patient's care was started at 5:18 PM.   Chief Complaint  Patient presents with  . Leg Swelling   Patient is a 28 y.o. female presenting with leg pain. The history is provided by the patient. No language interpreter was used.  Leg Pain Location:  Leg Time since incident:  2 months Injury: no   Leg location:  L leg Pain details:    Severity:  Moderate   Onset quality:  Gradual   Progression:  Worsening Associated symptoms: swelling   Associated symptoms: no back pain and no fatigue    HPI Comments: Heather Johnston is a 28 y.o. female, with a h/o lymphedema following multiple episodes of cellulitis, who presents to the Emergency Department complaining of gradually worsening bilateral leg swelling. R leg swelling onset over a year, L leg swelling onset 2 months ago. Pt reports associated pain in L lower leg.   Past Medical History  Diagnosis Date  . Lymphedema    Past Surgical History  Procedure Laterality Date  . Kidney surgery     History reviewed. No pertinent family history. History  Substance Use Topics  . Smoking status: Current Every Day Smoker    Types: Cigarettes  . Smokeless tobacco: Not on file  . Alcohol Use: Yes     Comment: occ   OB History   Grav Para Term Preterm Abortions TAB SAB Ect Mult Living                 Review of Systems  Constitutional: Negative for appetite change and fatigue.  HENT: Negative for congestion, ear discharge and sinus pressure.   Eyes: Negative for discharge.  Respiratory: Negative for cough.   Cardiovascular: Positive for leg swelling. Negative for chest pain.  Gastrointestinal: Negative for abdominal pain and diarrhea.  Genitourinary: Negative for frequency and hematuria.  Musculoskeletal: Negative for back pain.  Skin: Negative for rash.   Neurological: Negative for seizures and headaches.  Psychiatric/Behavioral: Negative for hallucinations.  All other systems reviewed and are negative.  Allergies  Bactrim and Penicillins  Home Medications   Prior to Admission medications   Medication Sig Start Date End Date Taking? Authorizing Provider  furosemide (LASIX) 20 MG tablet Take 10-20 mg by mouth daily as needed for fluid.    Historical Provider, MD  oxyCODONE-acetaminophen (PERCOCET/ROXICET) 5-325 MG per tablet Take 1-2 tablets by mouth every 4 (four) hours as needed for pain. 02/16/13   John-Adam Bonk, MD   Triage Vitals: BP 140/100  Pulse 94  Temp(Src) 98.1 F (36.7 C) (Oral)  Resp 20  Ht 5\' 3"  (1.6 m)  Wt 220 lb (99.791 kg)  BMI 38.98 kg/m2  SpO2 99%  LMP 01/18/2014 Physical Exam  Nursing note and vitals reviewed. Constitutional: She is oriented to person, place, and time. She appears well-developed.  HENT:  Head: Normocephalic.  Eyes: Conjunctivae are normal.  Neck: No tracheal deviation present.  Cardiovascular:  No murmur heard. Musculoskeletal: Normal range of motion. She exhibits edema.       Left foot: She exhibits tenderness.  3+ edema bilaterally from knees down Some tenderness and redness to L anterior foot  Neurological: She is oriented to person, place, and time.  Skin: Skin is warm.  Psychiatric: She has a normal mood and affect.   ED  Course  Procedures (including critical care time) DIAGNOSTIC STUDIES: Oxygen Saturation is 99% on RA, normal by my interpretation.    COORDINATION OF CARE: 5:22 PM-Discussed treatment plan which includes US and diagnostic lab work with pt at bedside and pt agreed to plan.   Labs Review Labs Reviewed  BASIC METABOLIC PANEL - Abnormal; Notable for the following:    Glucose, Bld 102 (*)    Creatinine, Ser 1.12 (*)    GFR calc non Af Amer 67 (*)    GFR calc Af Amer 77 (*)    All other components within normal limits  CBC WITH DIFFERENTIAL   Imaging  Review [place] Venous Img Lower Unilateral Left  01/27/2014   CLINICAL DATA:  Left lower extremity swelling.  EXAM: Left LOWER EXTREMITY VENOUS DOPPLER ULTRASOUND  TECHNIQUE: Gray-scale sonography with graded compression, as well as color Doppler and duplex ultrasound were performed to evaluate the lower extremity deep venous systems from the level of the common femoral vein and including the common femoral, femoral, profunda femoral, popliteal and calf veins including the posterior tibial, peroneal and gastrocnemius veins when visible. The superficial great saphenous vein was also interrogated. Spectral Doppler was utilized to evaluate flow at rest and with distal augmentation maneuvers in the common femoral, femoral and popliteal veins.  COMPARISON:  None.  FINDINGS: Common Femoral Vein: No evidence of thrombus. Normal compressibility, respiratory phasicity and response to augmentation.  Saphenofemoral Junction: No evidence of thrombus. Normal compressibility and flow on color Doppler imaging.  Profunda Femoral Vein: No evidence of thrombus. Normal compressibility and flow on color Doppler imaging.  Femoral Vein: No evidence of thrombus. Normal compressibility, respiratory phasicity and response to augmentation.  Popliteal Vein: No evidence of thrombus. Normal compressibility, respiratory phasicity and response to augmentation.  Calf Veins: No evidence of thrombus. Normal compressibility and flow on color Doppler imaging.  Subcutaneous edema is noted in the lower leg.  IMPRESSION:  No evidence of deep venous thrombosis.   Electronically Signed   By: Kennith CenterEric  Mansell M.D.   On: 01/27/2014 19:40     EKG Interpretation None      MDM   Final diagnoses:  None    Lymphedema .  Will cover for cellulitis  I personally performed the services described in this documentation, which was scribed in my presence. The recorded information has been reviewed and is accurate.  The chart was scribed for me under my direct  supervision.  I personally performed the history, physical, and medical decision making and all procedures in the evaluation of this patient.Heather Johnston.   Trayce Maino L Lokelani Lutes, MD 01/27/14 2020

## 2014-02-01 ENCOUNTER — Emergency Department: Payer: Self-pay | Admitting: Emergency Medicine

## 2014-02-01 LAB — URINALYSIS, COMPLETE
Bilirubin,UR: NEGATIVE
Blood: NEGATIVE
Glucose,UR: NEGATIVE mg/dL (ref 0–75)
KETONE: NEGATIVE
Nitrite: NEGATIVE
PH: 5 (ref 4.5–8.0)
PROTEIN: NEGATIVE
SPECIFIC GRAVITY: 1.021 (ref 1.003–1.030)
Squamous Epithelial: 6
WBC UR: 43 /HPF (ref 0–5)

## 2014-02-01 LAB — WET PREP, GENITAL

## 2014-02-01 LAB — GC/CHLAMYDIA PROBE AMP

## 2014-03-14 ENCOUNTER — Encounter (HOSPITAL_COMMUNITY): Payer: Self-pay | Admitting: Emergency Medicine

## 2014-03-14 ENCOUNTER — Emergency Department (HOSPITAL_COMMUNITY)
Admission: EM | Admit: 2014-03-14 | Discharge: 2014-03-14 | Disposition: A | Discharge status: Home / Self Care | Payer: Self-pay | Attending: Emergency Medicine | Admitting: Emergency Medicine

## 2014-03-14 DIAGNOSIS — Z8679 Personal history of other diseases of the circulatory system: Secondary | ICD-10-CM | POA: Insufficient documentation

## 2014-03-14 DIAGNOSIS — M5441 Lumbago with sciatica, right side: Secondary | ICD-10-CM

## 2014-03-14 DIAGNOSIS — IMO0002 Reserved for concepts with insufficient information to code with codable children: Secondary | ICD-10-CM | POA: Insufficient documentation

## 2014-03-14 DIAGNOSIS — Z79899 Other long term (current) drug therapy: Secondary | ICD-10-CM | POA: Insufficient documentation

## 2014-03-14 DIAGNOSIS — M549 Dorsalgia, unspecified: Secondary | ICD-10-CM | POA: Insufficient documentation

## 2014-03-14 DIAGNOSIS — Z880 Allergy status to penicillin: Secondary | ICD-10-CM | POA: Insufficient documentation

## 2014-03-14 DIAGNOSIS — F17200 Nicotine dependence, unspecified, uncomplicated: Secondary | ICD-10-CM | POA: Insufficient documentation

## 2014-03-14 DIAGNOSIS — M5430 Sciatica, unspecified side: Secondary | ICD-10-CM | POA: Insufficient documentation

## 2014-03-14 MED ORDER — HYDROCODONE-ACETAMINOPHEN 5-325 MG PO TABS: 2.0000 | ORAL_TABLET | ORAL | Status: DC | PRN

## 2014-03-14 MED ORDER — PREDNISONE 20 MG PO TABS: 20.0000 mg | ORAL_TABLET | Freq: Two times a day (BID) | ORAL | Status: DC

## 2014-03-14 MED ORDER — HYDROCODONE-ACETAMINOPHEN 5-325 MG PO TABS
2.0000 | ORAL_TABLET | Freq: Once | ORAL | Status: AC
  Administered 2014-03-14: 2 via ORAL
  Filled 2014-03-14: qty 2

## 2014-03-14 MED ORDER — PREDNISONE 20 MG PO TABS
60.0000 mg | ORAL_TABLET | Freq: Once | ORAL | Status: AC
Start: 2014-03-14 — End: 2014-03-14
  Administered 2014-03-14: 60 mg via ORAL
  Filled 2014-03-14: qty 3

## 2014-03-14 NOTE — ED Provider Notes (Signed)
CSN: [id number]     Arrival date & time 03/14/14  0114 History   None    Chief Complaint  Patient presents with  . Back Pain     (Consider location/radiation/quality/duration/timing/severity/associated sxs/prior Treatment) HPI  [name] is a 28 y.o. female is here for an exacerbation, of her recurrent low back pain, which radiates to her right leg. This is typical of her typical symptoms. She was told that she had a bulging disc, based on an MRI in February 2015. [facility] [place] She does not have a local doctor. She has relocated to this community. She denies bowel or bladder incontinence. She has not had fever, chills, nausea or vomiting. There are no other known modifying factors   Past Medical History  Diagnosis Date  . Lymphedema    Past Surgical History  Procedure Laterality Date  . Kidney surgery     No family history on file. History  Substance Use Topics  . Smoking status: Current Every Day Smoker    Types: Cigarettes  . Smokeless tobacco: Not on file  . Alcohol Use: Yes     Comment: occ   OB History   Grav Para Term Preterm Abortions TAB SAB Ect Mult Living                 Review of Systems  All other systems reviewed and are negative.     Allergies  Bactrim and Penicillins  Home Medications   Prior to Admission medications   Medication Sig Start Date End Date Taking? Authorizing Provider  HYDROcodone-acetaminophen (NORCO) 5-325 MG per tablet Take 2 tablets by mouth every 4 (four) hours as needed. 03/14/14   Flint MelterElliott L Claris Guymon, MD  predniSONE (DELTASONE) 20 MG tablet Take 1 tablet (20 mg total) by mouth 2 (two) times daily. 03/14/14   Flint MelterElliott L Nessa Ramaker, MD   BP 137/84  Pulse 97  Temp(Src) 98.4 F (36.9 C) (Oral)  Resp 20  Ht 5\' 3"  (1.6 m)  Wt 240 lb (108.863 kg)  BMI 42.52 kg/m2  SpO2 100%  LMP 03/03/2014 Physical Exam  Nursing note and vitals reviewed. Constitutional: She is oriented to person, place, and time.  She appears well-developed and well-nourished. No distress.  HENT:  Head: Normocephalic and atraumatic.  Eyes: Conjunctivae and EOM are normal. Pupils are equal, round, and reactive to light.  Neck: Normal range of motion and phonation normal. Neck supple.  Cardiovascular: Normal rate, regular rhythm and intact distal pulses.   Pulmonary/Chest: Effort normal and breath sounds normal. She exhibits no tenderness.  Abdominal: Soft. She exhibits no distension. There is no tenderness. There is no guarding.  Musculoskeletal: Normal range of motion. She exhibits edema (bilateral legs).  Is moderate tenderness to light touch of the lumbar area bilateral lower. She is able sit on the stretcher, without significant pain.  Neurological: She is alert and oriented to person, place, and time. She exhibits normal muscle tone.  Skin: Skin is warm and dry.  Psychiatric: She has a normal mood and affect. Her behavior is normal. Judgment and thought content normal.    ED Course  Procedures (including critical care time)  Medications  predniSONE (DELTASONE) tablet 60 mg (60 mg Oral Given 03/14/14 0316)  HYDROcodone-acetaminophen (NORCO/VICODIN) 5-325 MG per tablet 2 tablet (2 tablets Oral Given 03/14/14 0317)    Patient Vitals for the past 24 hrs:  BP Temp Temp src Pulse Resp SpO2 Height Weight  03/14/14 0339 137/84 mmHg 98.4 F (  36.9 C) Oral 97 20 100 % - -  03/14/14 0315 139/93 mmHg - - 99 18 99 % - -  03/14/14 0300 144/97 mmHg - - 94 16 99 % - -  03/14/14 0245 142/88 mmHg - - 92 17 100 % - -  03/14/14 0230 146/89 mmHg - - 90 17 100 % - -  03/14/14 0215 145/85 mmHg - - 94 23 100 % - -  03/14/14 0200 148/92 mmHg - - - 22 100 % - -  03/14/14 0200 148/94 mmHg - - 99 21 100 % - -  03/14/14 0122 175/105 mmHg 98.9 F (37.2 C) Oral 99 18 99 % 5\' 3"  (1.6 m) 240 lb (108.863 kg)       Labs Review Labs Reviewed - No data to display  Imaging Review No results found.   EKG Interpretation None       MDM   Final diagnoses:  Low back pain with right-sided sciatica, unspecified back pain laterality    Recurrent right low back pain, with sciatica. Doubt HNP, cauda equina, or significant lumbar radiculopathy.  Nursing Notes Reviewed/ Care Coordinated Applicable Imaging Reviewed Interpretation of Laboratory Data incorporated into ED treatment  The patient appears reasonably screened and/or stabilized for discharge and I doubt any other medical condition or other [facility] requiring further screening, evaluation, or treatment in the ED at this time prior to discharge.  Plan: Home Medications- Norco, Prednisone; Home Treatments- rest, heat; return here if the recommended treatment, does not improve the symptoms; Recommended follow up- PCP prn    Flint Melter, MD 03/14/14 [phone]

## 2014-03-14 NOTE — Discharge Instructions (Signed)
Back Pain, Adult °Back pain is very common. The pain often gets better over time. The cause of back pain is usually not dangerous. Most people can learn to manage their back pain on their own.  °HOME CARE  °· Stay active. Start with short walks on flat ground if you can. Try to walk farther each day. °· Do not sit, drive, or stand in one place for more than 30 minutes. Do not stay in bed. °· Do not avoid exercise or work. Activity can help your back heal faster. °· Be careful when you bend or lift an object. Bend at your knees, keep the object close to you, and do not twist. °· Sleep on a firm mattress. Lie on your side, and bend your knees. If you lie on your back, put a pillow under your knees. °· Only take medicines as told by your doctor. °· Put ice on the injured area. °¨ Put ice in a plastic bag. °¨ Place a towel between your skin and the bag. °¨ Leave the ice on for 15-20 minutes, 03-04 times a day for the first 2 to 3 days. After that, you can switch between ice and heat packs. °· Ask your doctor about back exercises or massage. °· Avoid feeling anxious or stressed. Find good ways to deal with stress, such as exercise. °GET HELP RIGHT AWAY IF:  °· Your pain does not go away with rest or medicine. °· Your pain does not go away in 1 week. °· You have new problems. °· You do not feel well. °· The pain spreads into your legs. °· You cannot control when you poop (bowel movement) or pee (urinate). °· Your arms or legs feel weak or lose feeling (numbness). °· You feel sick to your stomach (nauseous) or throw up (vomit). °· You have belly (abdominal) pain. °· You feel like you may pass out (faint). °MAKE SURE YOU:  °· Understand these instructions. °· Will watch your condition. °· Will get help right away if you are not doing well or get worse. °Document Released: 12/28/2007 Document Revised: 10/03/2011 Document Reviewed: 11/12/2013 °ExitCare® Patient Information ©2015 ExitCare, LLC. This information is not intended  to replace advice given to you by your health care provider. Make sure you discuss any questions you have with your health care provider. ° °Back Exercises °Back exercises help treat and prevent back injuries. The goal is to increase your strength in your belly (abdominal) and back muscles. These exercises can also help with flexibility. Start these exercises when told by your doctor. °HOME CARE °Back exercises include: °Pelvic Tilt. °· Lie on your back with your knees bent. Tilt your pelvis until the lower part of your back is against the floor. Hold this position 5 to 10 sec. Repeat this exercise 5 to 10 times. °Knee to Chest. °· Pull 1 knee up against your chest and hold for 20 to 30 seconds. Repeat this with the other knee. This may be done with the other leg straight or bent, whichever feels better. Then, pull both knees up against your chest. °Sit-Ups or Curl-Ups. °· Bend your knees 90 degrees. Start with tilting your pelvis, and do a partial, slow sit-up. Only lift your upper half 30 to 45 degrees off the floor. Take at least 2 to 3 seonds for each sit-up. Do not do sit-ups with your knees out straight. If partial sit-ups are difficult, simply do the above but with only tightening your belly (abdominal) muscles and holding it as   told. Hip-Lift.  Lie on your back with your knees flexed 90 degrees. Push down with your feet and shoulders as you raise your hips 2 inches off the floor. Hold for 10 seconds, repeat 5 to 10 times. Back Arches.  Lie on your stomach. Prop yourself up on bent elbows. Slowly press on your hands, causing an arch in your low back. Repeat 3 to 5 times. Shoulder-Lifts.  Lie face down with arms beside your body. Keep hips and belly pressed to floor as you slowly lift your head and shoulders off the floor. Do not overdo your exercises. Be careful in the beginning. Exercises may cause you some mild back discomfort. If the pain lasts for more than 15 minutes, stop the exercises until  you see your doctor. Improvement with exercise for back problems is slow.  Document Released: 08/13/2010 Document Revised: 10/03/2011 Document Reviewed: 05/12/2011 [facility] Patient Information 2015 [employer], [place]. This information is not intended to replace advice given to you by your health care provider. Make sure you discuss any questions you have with your health care provider.   Emergency Department Resource Guide 1) Find a Doctor and Pay Out of Pocket Although you won't have to find out who is covered by your insurance plan, it is a good idea to ask around and get recommendations. You will then need to call the office and see if the doctor you have chosen will accept you as a new patient and what types of options they offer for patients who are self-pay. Some doctors offer discounts or will set up payment plans for their patients who do not have insurance, but you will need to ask so you aren't surprised when you get to your appointment.  2) Contact Your Local Health Department Not all health departments have doctors that can see patients for sick visits, but many do, so it is worth a call to see if yours does. If you don't know where your local health department is, you can check in your phone book. The CDC also has a tool to help you locate your state's health department, and many state websites also have listings of all of their local health departments.  3) Find a Walk-in Clinic If your illness is not likely to be very severe or complicated, you may want to try a walk in clinic. These are popping up all over the country in pharmacies, drugstores, and shopping centers. They're usually staffed by nurse practitioners or physician assistants that have been trained to treat common illnesses and complaints. They're usually fairly quick and inexpensive. However, if you have serious medical issues or chronic medical problems, these are probably not your best option.  No Primary Care Doctor: - Call  Health Connect at  [phone] - they can help you locate a primary care doctor that  accepts your insurance, provides certain services, etc. - Physician Referral Service- [phone]  Chronic Pain Problems: Organization         Address  Phone   Notes  [facility]  [phone] Patients need to be referred by their primary care doctor.   Medication Assistance: Organization         Address  Phone   Notes  [facility] Medication [facility] [address] [place], [place] [address] [phone] --Must be a resident of [facility] -- Must have NO insurance coverage whatsoever (no Medicaid/ Medicare, etc.) -- The pt. MUST have a primary care doctor that directs their care  regularly and follows them in the community   [facility]  [phone]   [employer]  [phone]    Agencies that provide inexpensive medical care: Organization         Address  Phone   Notes  [facility]  [phone]   [facility]    [phone]   [facility] [address] [place], [place] [address] [phone]   [facility] [address], [place] [phone]   Planned Parenthood    [phone]   [facility]    [phone]   [facility]  [address], [place] Phone:  [phone], Fax:  [phone] Hours of Operation:  9 am - 6 pm, M-F.  Also accepts Medicaid/Medicare and self-pay.  [facility]  [address], [place] Phone: [phone], Fax: [phone]. Hours of Operation:  8:30 am - 5:30 pm, M-F.  Also accepts Medicaid and self-pay.  [facility] [place] [address] [place] Phone: [phone]   [facility] [address] Natasha Bence [place], [place] [phone], Ext. [phone] Mondays & Thursdays: 7-9 AM.  First 15 patients are seen on a first come, first serve  basis.    Medicaid-accepting [facility] Providers:  Organization         Address  Phone   Notes  [facility] [address], [place] [phone] Also accepts self-pay patients.  [facility] [address] Laurell Josephs [place], [place]  [phone]   [facility] [address], [place] [phone]   [facility] [address], [place] [phone]   Renaye Rakers [address], [place]   [phone] Only accepts [place] patients after they have their name applied to their card.   Self-Pay (no insurance) in [facility]:  Organization         Address  Phone   Notes  Sickle Cell Patients, [facility] [address] [place], [place] [phone]   [facility] [address] [place], [place] [phone]   [facility] [place]  [address],  [phone]   [facility]/Dr. Osei-Bonsu  [address] [place] or [address], [place] [phone] Phone number for both [place] and [place] locations is the same.  Urgent Medical and [facility] [address] [place] [phone]   [facility] [address], [place] or [address] [phone]   [facility] [address], [place] [phone], phone; [phone], fax Sees patients 1st and 3rd Saturday of every month.  Must not qualify for public or private insurance (i.e. Medicaid, Medicare, [place] Health Choice, Veterans' Benefits)  Household income should be no more than 200% of the poverty level The clinic cannot treat you if you are pregnant or think you are pregnant  Sexually transmitted diseases are not treated at the clinic.    Dental Care: Organization         Address  Phone  Notes  [facility] [address] [place], [place] [phone] Accepts children up to age 75  who are enrolled in Medicaid or [place] Health Choice; pregnant women with a Medicaid card; and children who have applied for Medicaid or [place] Health Choice, but were declined, whose parents can pay a reduced fee at time of service.  [facility] of [facility]  [address], [place] [phone] Accepts children up to age 35 who are enrolled in [place] or [place] Health Choice; pregnant women with a Medicaid card; and children who have applied for Medicaid or [place] Health Choice, but were declined, whose parents can pay a reduced fee at time of service.  [facility]  [address] [place], [place] [phone] Patients are seen by appointment only. Walk-ins are not accepted. [facility] will see patients 9 years of age and older. Monday - Tuesday (8am-5pm) Most Wednesdays (8:30-5pm) $30 per visit, cash only  [facility]  [address], [facility] [phone] Patients are seen by appointment only. Walk-ins are not accepted. [facility] will see patients 61 years of age and older. One Wednesday Evening (Monthly: Volunteer Based).  $30 per visit, cash only  [employer]  [phone] for adults; Children under age 68, call Graduate Pediatric Dentistry at [phone]. Children aged 13-14, please call [phone] to request a pediatric application.  Dental services are provided in all areas of dental care including fillings, crowns and bridges, complete and partial dentures, implants, gum treatment, root canals, and extractions. Preventive care is also provided. Treatment is provided to both adults and children. Patients are selected via a lottery and there is often a waiting list.   [facility] [address] [place]  [phone]  [email or web address]   [facility] [address] [place], [place] [phone], Ext. [phone] Second and Fourth Thursday of each month, opens at 6:30 AM; Clinic ends at 9 AM.  Patients are seen on a first-come first-served basis, and a limited number are seen during each clinic.   [facility]  [address] Ether Griffins [place], [place] [phone]   Eligibility Requirements You must have lived in [place], [place], or [place] counties for at least the last three months.   You cannot be eligible for state or federal sponsored [place], including [employer], [place], or [employer].   You generally cannot be eligible for healthcare insurance through your employer.    How to apply: Eligibility screenings are held every Tuesday and Wednesday afternoon from 1:00 pm until 4:00 pm. You do not need an appointment for the interview!  [facility] [address] [place], [place] [phone]   [facility] Health Department  [phone]   [facility]  [phone]   [facility] Health Department  [phone]    Behavioral Health Resources in the Community: Intensive Outpatient Programs Organization         Address  Phone  Notes  [facility] [address] [place], [place] [phone]   [facility] Outpatient [address] [place], [place] [phone]   [facility] [address], [place], [place]  [phone]   [facility] [address],  [place], [place] [phone] or [phone]   Substance Abuse Resources Organization         Address  Phone  Notes  [facility]  [phone]   [facility]  [phone]  [facility]  [phone]   [facility]  [phone]   Residential & Outpatient Substance Abuse Program  [phone]   Psychological Services Organization          Address  Phone  Notes  [facility]  [phone]- [phone]   [facility]  [phone]- [phone]   [facility] [address], [place] [phone] or [phone]    Mobile Crisis Teams Organization         Address  Phone  Notes  [facility], Mobile Crisis Care Unit  [phone]   [facility]  [address] [place], [place] [phone]   Doristine LocksSharon DeEsch [address] [place] [phone]    Self-Help/Support Groups Organization         Address  Phone             Notes  [facility] [place] - variety of support groups  [phone] Call for more information  Narcotics Anonymous (NA), [facility] [address], [place]  2 meetings at this location   Statisticianesidential Treatment Programs Organization         Address  Phone  Notes  [facility] [address],    [place]  [phone]   [facility]  [address], [place] [phone], [place], [place] [phone]   [facility] [address], [place] [phone] Admissions: 8am-3pm M-F  [facility] [address]    [place], [place] [phone]   [facility] [address], [place], [place] [phone]   [facility] [address]  [place], [place] [phone]   [facility] - Intensive Outpatient [address] Josephs[address], [place], [place] [phone]   [facility] [address]  [place], [place] [phone] or [phone]   [facility] (RT[facility]) [address] [place], [place] [phone] Accepts Medicaid  [facility] [address] [place] [address]  [place] [phone] Substance Abuse/Addiction Treatment   [facility] Behavioral Health Resources Organization         Address  Phone  Notes  [facility]  [phone]   Angie FavaJulie Brannon, PhD [address], [address] [place], [place]   [phone](336) [phone] or [phone]   [facility]   [address] [place], [place] [phone]) [phone]   [facility] [address], [place], [place] [phone] Insurance/Medicaid/sponsorship through [facility]  [facility] [address]                                    [place], [place] [phone]) [phone] Therapy/tele-psych/case  [facility] [address]   [place], [place] [phone]) [phone]    Dr. Lolly MustacheArfeen  [phone]   [facility]  [employer] [facility] 1) [address], [place] 2) [address], [place] 3)  [address], [place] [phone]  [phone](336) [phone]   [facility] Child Abuse Hotline [phone]) 8287128862 or [phone](336) [phone] (After Hours)

## 2014-03-14 NOTE — ED Notes (Signed)
Pt presents with right lower back pain since yesterday, reports pain radiates down right leg.  Denies changes in bowel or bladder, ambulatory without difficulty.  Hx of bulging disc, denies injury.

## 2014-04-08 ENCOUNTER — Encounter (HOSPITAL_COMMUNITY): Payer: Self-pay | Admitting: Emergency Medicine

## 2014-04-08 ENCOUNTER — Emergency Department (HOSPITAL_COMMUNITY)
Admission: EM | Admit: 2014-04-08 | Discharge: 2014-04-08 | Disposition: A | Discharge status: Home / Self Care | Payer: Self-pay | Attending: Emergency Medicine | Admitting: Emergency Medicine

## 2014-04-08 DIAGNOSIS — M7989 Other specified soft tissue disorders: Secondary | ICD-10-CM | POA: Insufficient documentation

## 2014-04-08 DIAGNOSIS — F17200 Nicotine dependence, unspecified, uncomplicated: Secondary | ICD-10-CM | POA: Insufficient documentation

## 2014-04-08 DIAGNOSIS — R21 Rash and other nonspecific skin eruption: Secondary | ICD-10-CM | POA: Insufficient documentation

## 2014-04-08 DIAGNOSIS — Z880 Allergy status to penicillin: Secondary | ICD-10-CM | POA: Insufficient documentation

## 2014-04-08 DIAGNOSIS — Z8679 Personal history of other diseases of the circulatory system: Secondary | ICD-10-CM | POA: Insufficient documentation

## 2014-04-08 LAB — CBC WITH DIFFERENTIAL/PLATELET
BASOS ABS: 0 10*3/uL (ref 0.0–0.1)
BASOS PCT: 0 % (ref 0–1)
EOS ABS: 0.2 10*3/uL (ref 0.0–0.7)
EOS PCT: 2 % (ref 0–5)
HCT: 37.5 % (ref 36.0–46.0)
Hemoglobin: 13.2 g/dL (ref 12.0–15.0)
LYMPHS ABS: 2.7 10*3/uL (ref 0.7–4.0)
Lymphocytes Relative: 34 % (ref 12–46)
MCH: 31 pg (ref 26.0–34.0)
MCHC: 35.2 g/dL (ref 30.0–36.0)
MCV: 88 fL (ref 78.0–100.0)
Monocytes Absolute: 0.8 10*3/uL (ref 0.1–1.0)
Monocytes Relative: 9 % (ref 3–12)
Neutro Abs: 4.3 10*3/uL (ref 1.7–7.7)
Neutrophils Relative %: 55 % (ref 43–77)
PLATELETS: 291 10*3/uL (ref 150–400)
RBC: 4.26 MIL/uL (ref 3.87–5.11)
RDW: 12.7 % (ref 11.5–15.5)
WBC: 7.9 10*3/uL (ref 4.0–10.5)

## 2014-04-08 LAB — BASIC METABOLIC PANEL
Anion gap: 11 (ref 5–15)
BUN: 13 mg/dL (ref 6–23)
CALCIUM: 9 mg/dL (ref 8.4–10.5)
CO2: 24 mEq/L (ref 19–32)
CREATININE: 1.17 mg/dL — AB (ref 0.50–1.10)
Chloride: 105 mEq/L (ref 96–112)
GFR calc Af Amer: 73 mL/min — ABNORMAL LOW (ref >90)
GFR, EST NON AFRICAN AMERICAN: 63 mL/min — AB (ref >90)
GLUCOSE: 91 mg/dL (ref 70–99)
Potassium: 3.8 mEq/L (ref 3.7–5.3)
Sodium: 140 mEq/L (ref 137–147)

## 2014-04-08 LAB — D-DIMER, QUANTITATIVE (NOT AT ARMC): D-Dimer, Quant: 0.47 ug/mL-FEU (ref 0.00–0.48)

## 2014-04-08 MED ORDER — NAPROXEN 500 MG PO TABS: 500.0000 mg | ORAL_TABLET | Freq: Two times a day (BID) | ORAL | Status: DC

## 2014-04-08 NOTE — Discharge Instructions (Signed)
Symptoms seem to be consistent with your past leg swelling due to some lymphatics. No evidence of infection. Screening tests for blood clots was negative unlikely to be a deep vein thrombosis Baker's cyst is also some swelling of the left leg as well. Trial of anti-inflammatories try to elevate your legs is much as possible off your feet. Work note provided.

## 2014-04-08 NOTE — ED Notes (Signed)
PT c/o right lower leg swelling and pain x3 days. PT had dx of lymphedema. PT denies any injury.

## 2014-04-08 NOTE — ED Provider Notes (Signed)
CSN: [id number]     Arrival date & time 04/08/14  1634 History  This chart was scribed for Heather Mulders, MD, by Yevette Edwards, ED Scribe. This patient was seen in room [facility]11/[facility]11 and the patient's care was started at 5:23 PM.   First MD Initiated Contact with Patient 04/08/14 1650     Chief Complaint  Patient presents with  . Leg Swelling    Patient is a 28 y.o. female presenting with leg pain. The history is provided by the patient. No language interpreter was used.  Leg Pain Location:  Leg Time since incident:  3 days Injury: no   Leg location:  R lower leg Pain details:    Quality:  Unable to specify   Radiates to:  Does not radiate   Severity:  Moderate (7/10)   Onset quality:  Gradual   Timing:  Constant   Progression:  Worsening Chronicity:  Chronic Dislocation: no   Foreign body present:  No foreign bodies Relieved by:  Rest and elevation Worsened by:  Bearing weight and activity Associated symptoms: swelling   Associated symptoms: no back pain, no fever and no neck pain   Risk factors: obesity    HPI Comments: Heather Johnston is a 28 y.o. female, with a h/o lymphedema, who presents to the Emergency Department complaining of gradually-increasing swelling to her right lower leg over the course of three days. She reports the swelling is associated with pain, and she rates the pain as 7/10. She denies chest pain or SOB. She reports she was diagnosed with lymphedema to the right leg last year and has experienced baseline swelling to the leg; the swelling has increased with a return to work; she works as a Adult nurse. She states the lymphedema is not associated with vascular disease. She has been unable to follow-up with a PCP in the past few months due to lack of insurance. Heather Johnston is a current smoker.   No PCP.  Past Medical History  Diagnosis Date  . Lymphedema    Past Surgical History  Procedure Laterality Date  . Kidney surgery     No family history on  file. History  Substance Use Topics  . Smoking status: Current Every Day Smoker -- 0.50 packs/day    Types: Cigarettes  . Smokeless tobacco: Not on file  . Alcohol Use: Yes     Comment: occ   No OB history provided.  Review of Systems  Constitutional: Negative for fever and chills.  HENT: Negative for congestion and sore throat.   Eyes: Negative for visual disturbance.  Respiratory: Negative for shortness of breath.   Cardiovascular: Positive for leg swelling. Negative for chest pain.  Gastrointestinal: Negative for nausea, vomiting and diarrhea.  Genitourinary: Negative for dysuria.  Musculoskeletal: Negative for back pain and neck pain.  Skin: Positive for rash.  Neurological: Negative for headaches.  Hematological: Does not bruise/bleed easily.    Allergies  Bactrim and Penicillins  Home Medications   Prior to Admission medications   Medication Sig Start Date End Date Taking? Authorizing Provider  Multiple Vitamin (MULTIVITAMIN WITH MINERALS) TABS tablet Take 1 tablet by mouth daily.   Yes Historical Provider, MD  naproxen (NAPROSYN) 500 MG tablet Take 1 tablet (500 mg total) by mouth 2 (two) times daily. 04/08/14   Heather Mulders, MD   Triage Vitals: BP 156/100  Pulse 92  Temp(Src) 99.7 F (37.6 C) (Oral)  Resp 18  Ht  (1.6 m)  Wt 240 lb (108.863 kg)  BMI 42.52 kg/m2  SpO2 100%  LMP 03/03/2014  Physical Exam  Nursing note and vitals reviewed. Constitutional: She is oriented to person, place, and time. She appears well-developed and well-nourished. No distress.  HENT:  Head: Normocephalic and atraumatic.  Eyes: Conjunctivae and EOM are normal.  Neck: Neck supple. No tracheal deviation present.  Cardiovascular: Normal rate, regular rhythm and normal heart sounds.   No murmur heard. Cap refill is 1 second to both great toes.   Pulmonary/Chest: Effort normal and breath sounds normal. No respiratory distress. She has no wheezes. She has no rales.   Musculoskeletal: Normal range of motion.  Puffiness to both feet, greater on the right. Swelling does not extend past knees. No redness to the legs.   Neurological: She is alert and oriented to person, place, and time. She has normal reflexes. No cranial nerve deficit. Coordination normal.  Skin: Skin is warm and dry. Rash noted.  Papular rash scattered over an area of 15 cm. No vesicles. No pustules. No particular pattern.   Psychiatric: She has a normal mood and affect. Her behavior is normal.    ED Course  Procedures (including critical care time)  DIAGNOSTIC STUDIES: Oxygen Saturation is 100% on room air, normal by my interpretation.    COORDINATION OF CARE:  5:34 PM- Discussed treatment plan with patient, and the patient agreed to the plan.   Results for orders placed during the hospital encounter of 04/08/14  BASIC METABOLIC PANEL      Result Value Ref Range   Sodium 140  137 - 147 mEq/L   Potassium 3.8  3.7 - 5.3 mEq/L   Chloride 105  96 - 112 mEq/L   CO2 24  19 - 32 mEq/L   Glucose, Bld 91  70 - 99 mg/dL   BUN 13  6 - 23 mg/dL   Creatinine, Ser 1.91 (*) 0.50 - 1.10 mg/dL   Calcium 9.0  8.4 - 47.8 mg/dL   GFR calc non Af Amer 63 (*) >90 mL/min   GFR calc Af Amer 73 (*) >90 mL/min   Anion gap 11  5 - 15  CBC WITH DIFFERENTIAL      Result Value Ref Range   WBC 7.9  4.0 - 10.5 K/uL   RBC 4.26  3.87 - 5.11 MIL/uL   Hemoglobin 13.2  12.0 - 15.0 g/dL   HCT 29.5  62.1 - 30.8 %   MCV 88.0  78.0 - 100.0 fL   MCH 31.0  26.0 - 34.0 pg   MCHC 35.2  30.0 - 36.0 g/dL   RDW 65.7  84.6 - 96.2 %   Platelets 291  150 - 400 K/uL   Neutrophils Relative % 55  43 - 77 %   Neutro Abs 4.3  1.7 - 7.7 K/uL   Lymphocytes Relative 34  12 - 46 %   Lymphs Abs 2.7  0.7 - 4.0 K/uL   Monocytes Relative 9  3 - 12 %   Monocytes Absolute 0.8  0.1 - 1.0 K/uL   Eosinophils Relative 2  0 - 5 %   Eosinophils Absolute 0.2  0.0 - 0.7 K/uL   Basophils Relative 0  0 - 1 %   Basophils Absolute 0.0   0.0 - 0.1 K/uL  D-DIMER, QUANTITATIVE      Result Value Ref Range   D-Dimer, Quant 0.47  0.00 - 0.48 ug/mL-FEU   No results found.    MDM   Final diagnoses:  Right leg swelling  Patient with history of right leg swelling in the past due to lymphedema. Patient states this usually comes up when she's been on her feet a lot. Patient's had the leg swelling to the right lower leg over the past 3 days. Also has a little bit to the left. Patient is told that possibly she could get a blood clot she's not had one in the past. Patient arrived here after ultrasound not available. However d-dimer is negative in this sounds classic for which she's had before. We'll treat with leg rest elevation and anti-inflammatories. Patient has no shortness of breath no chest pain.   I personally performed the services described in this documentation, which was scribed in my presence. The recorded information has been reviewed and is accurate.      Heather Mulders, MD 04/08/14 1932

## 2014-07-08 ENCOUNTER — Encounter (HOSPITAL_COMMUNITY): Payer: Self-pay | Admitting: Emergency Medicine

## 2014-07-08 ENCOUNTER — Emergency Department (HOSPITAL_COMMUNITY)
Admission: EM | Admit: 2014-07-08 | Discharge: 2014-07-08 | Discharge status: Against Medical Advice | Payer: Self-pay | Attending: Emergency Medicine | Admitting: Emergency Medicine

## 2014-07-08 DIAGNOSIS — Z720 Tobacco use: Secondary | ICD-10-CM | POA: Insufficient documentation

## 2014-07-08 DIAGNOSIS — Z8679 Personal history of other diseases of the circulatory system: Secondary | ICD-10-CM | POA: Insufficient documentation

## 2014-07-08 DIAGNOSIS — M25572 Pain in left ankle and joints of left foot: Secondary | ICD-10-CM | POA: Insufficient documentation

## 2014-07-08 DIAGNOSIS — M25571 Pain in right ankle and joints of right foot: Secondary | ICD-10-CM | POA: Insufficient documentation

## 2014-07-08 NOTE — ED Notes (Signed)
PT called x3 for room from triage with no answer.

## 2014-07-08 NOTE — ED Notes (Addendum)
PT reports hx of lymphedema to bilateral lower legs with pain in right ankle and foot for 1 week.  PT denies any injury to RLE. PT reports normal swelling to right lower leg.

## 2014-07-08 NOTE — ED Notes (Signed)
PT called x1 from triage for room with no answer.

## 2014-07-21 ENCOUNTER — Emergency Department (HOSPITAL_COMMUNITY)
Admission: EM | Admit: 2014-07-21 | Discharge: 2014-07-21 | Disposition: A | Discharge status: Home / Self Care | Payer: Self-pay | Attending: Emergency Medicine | Admitting: Emergency Medicine

## 2014-07-21 ENCOUNTER — Encounter (HOSPITAL_COMMUNITY): Payer: Self-pay | Admitting: Emergency Medicine

## 2014-07-21 DIAGNOSIS — R000 Tachycardia, unspecified: Secondary | ICD-10-CM | POA: Insufficient documentation

## 2014-07-21 DIAGNOSIS — Z792 Long term (current) use of antibiotics: Secondary | ICD-10-CM | POA: Insufficient documentation

## 2014-07-21 DIAGNOSIS — Z791 Long term (current) use of non-steroidal anti-inflammatories (NSAID): Secondary | ICD-10-CM | POA: Insufficient documentation

## 2014-07-21 DIAGNOSIS — Z8679 Personal history of other diseases of the circulatory system: Secondary | ICD-10-CM | POA: Insufficient documentation

## 2014-07-21 DIAGNOSIS — Z880 Allergy status to penicillin: Secondary | ICD-10-CM | POA: Insufficient documentation

## 2014-07-21 DIAGNOSIS — I10 Essential (primary) hypertension: Secondary | ICD-10-CM | POA: Insufficient documentation

## 2014-07-21 DIAGNOSIS — Z720 Tobacco use: Secondary | ICD-10-CM | POA: Insufficient documentation

## 2014-07-21 DIAGNOSIS — L0501 Pilonidal cyst with abscess: Secondary | ICD-10-CM | POA: Insufficient documentation

## 2014-07-21 MED ORDER — HYDROCODONE-ACETAMINOPHEN 5-325 MG PO TABS: 1.0000 | ORAL_TABLET | Freq: Four times a day (QID) | ORAL | Status: DC | PRN

## 2014-07-21 MED ORDER — POVIDONE-IODINE 10 % EX SOLN
CUTANEOUS | Status: AC
  Filled 2014-07-21: qty 118

## 2014-07-21 MED ORDER — LIDOCAINE-EPINEPHRINE (PF) 2 %-1:200000 IJ SOLN
10.0000 mL | Freq: Once | INTRAMUSCULAR | Status: AC
  Administered 2014-07-21: 10 mL
  Filled 2014-07-21: qty 20

## 2014-07-21 MED ORDER — IBUPROFEN 800 MG PO TABS
800.0000 mg | ORAL_TABLET | Freq: Once | ORAL | Status: AC
  Administered 2014-07-21: 800 mg via ORAL
  Filled 2014-07-21: qty 1

## 2014-07-21 MED ORDER — CLINDAMYCIN HCL 150 MG PO CAPS: 450.0000 mg | ORAL_CAPSULE | Freq: Three times a day (TID) | ORAL | Status: DC

## 2014-07-21 NOTE — ED Provider Notes (Signed)
CSN: [id number]     Arrival date & time 07/21/14  [id number] History   First MD Initiated Contact with Patient 07/21/14 0759     Chief Complaint  Patient presents with  . Cyst     (Consider location/radiation/quality/duration/timing/severity/associated sxs/prior Treatment) The history is provided by the patient and medical records. No language interpreter was used.     Heather Johnston is a 28 y.o. female  with a hx of lymphedema, recurrent abscess presents to the Emergency Department complaining of gradual, persistent, progressively worsening "cyst" between right and left buttock with a hx of the same onset yesterday.  Pt reports her last flare in this area was 1 year ago.  Associated symptoms include pain and swelling at the site.  Nothing makes it better and nothing makes it worse.  No treatments PTA.  Pt denies fever, [place], headache, neck pain, chest pain, shortness of breath, abdominal pain, nausea, vomiting, diarrhea, weakness, dizziness, syncope, dysuria, hematuria.     Past Medical History  Diagnosis Date  . Lymphedema    Past Surgical History  Procedure Laterality Date  . Kidney surgery     History reviewed. No pertinent family history. History  Substance Use Topics  . Smoking status: Current Every Day Smoker -- 0.50 packs/day    Types: Cigarettes  . Smokeless tobacco: Not on file  . Alcohol Use: Yes     Comment: occ   OB History    No data available     Review of Systems  Constitutional: Negative for fever and chills.  Gastrointestinal: Negative for nausea and vomiting.  Skin:       abscess   Allergic/Immunologic: Negative for immunocompromised state.  Hematological: Does not bruise/bleed easily.  Psychiatric/Behavioral: The patient is not nervous/anxious.       Allergies  Bactrim and Penicillins  Home Medications   Prior to Admission medications   Medication Sig Start Date End Date Taking? Authorizing Provider  clindamycin (CLEOCIN) 150 MG capsule Take 3  capsules (450 mg total) by mouth 3 (three) times daily. 07/21/14   Judson Tsan, PA-C  HYDROcodone-acetaminophen (NORCO/VICODIN) 5-325 MG per tablet Take 1 tablet by mouth every 6 (six) hours as needed for moderate pain or severe pain. 07/21/14   Meganne Rita, PA-C  Multiple Vitamin (MULTIVITAMIN WITH MINERALS) TABS tablet Take 1 tablet by mouth daily.    Historical Provider, MD  naproxen (NAPROSYN) 500 MG tablet Take 1 tablet (500 mg total) by mouth 2 (two) times daily. 04/08/14   Vanetta MuldersScott Zackowski, MD   BP 157/115 mmHg  Pulse 104  Temp(Src) 99.2 F (37.3 C) (Oral)  Resp 18  Ht 5\' 3"  (1.6 m)  Wt 220 lb (99.791 kg)  BMI 38.98 kg/m2  SpO2 100%  LMP 06/17/2014 Physical Exam  Constitutional: She is oriented to person, place, and time. She appears well-developed and well-nourished. No distress.  HENT:  Head: Normocephalic and atraumatic.  Eyes: Conjunctivae are normal. No scleral icterus.  Neck: Normal range of motion.  Cardiovascular: Regular rhythm, normal heart sounds and intact distal pulses.   Mild tachycardia  Pulmonary/Chest: Effort normal and breath sounds normal. No respiratory distress.  Abdominal: Soft. She exhibits no distension. There is no tenderness.  Lymphadenopathy:    She has no cervical adenopathy.  Neurological: She is alert and oriented to person, place, and time.  Skin: Skin is warm and dry. She is not diaphoretic. There is erythema.  Erythema, swelling and induration the top of the gluteal cleft midline with extension of the  swelling towards the left buttock; tenderness to palpation, no drainage  Psychiatric: She has a normal mood and affect.  Nursing note and vitals reviewed.   ED Course  INCISION AND DRAINAGE Date/Time: 07/21/2014 8:45 AM Performed by: Dierdre ForthMUTHERSBAUGH, Panagiotis Oelkers Authorized by: Dierdre ForthMUTHERSBAUGH, Marthella Osorno Consent: Verbal consent obtained. Risks and benefits: risks, benefits and alternatives were discussed Consent given by: patient Patient  understanding: patient states understanding of the procedure being performed Patient consent: the patient's understanding of the procedure matches consent given Procedure consent: procedure consent matches procedure scheduled Relevant documents: relevant documents present and verified Site marked: the operative site was marked Imaging studies: imaging studies available (bedside US) Required items: required blood products, implants, devices, and special equipment available Patient identity confirmed: verbally with patient and arm band Time out: Immediately prior to procedure a "time out" was called to verify the correct patient, procedure, equipment, support staff and site/side marked as required. Type: abscess Body area: anogenital Location details: gluteal cleft Anesthesia: local infiltration Local anesthetic: lidocaine 2% with epinephrine Anesthetic total: 3 ml Patient sedated: no Scalpel size: 11 Incision type: single straight Complexity: complex Drainage: purulent Drainage amount: copious Wound treatment: wound left open Packing material: 1/4 in gauze Patient tolerance: Patient tolerated the procedure well with no immediate complications   (including critical care time) Labs Review Labs Reviewed - No data to display  Imaging Review No results found.   EKG Interpretation None      EMERGENCY DEPARTMENT US SOFT TISSUE INTERPRETATION "Study: Limited Ultrasound of the noted body part in comments below"  INDICATIONS: Pain Multiple views of the body part are obtained with a multi-frequency linear probe  PERFORMED BY:  Myself  IMAGES ARCHIVED?: Yes  SIDE:Midline  BODY PART: Gluteal cleft and left buttock  FINDINGS: Abcess present  LIMITATIONS:  Body Habitus  INTERPRETATION:  Abcess present  COMMENT:  Large abscess present, minimal cellulitis    MDM   Final diagnoses:  Pilonidal abscess  Essential hypertension   Heather Johnston presents with pilonidal  cyst.  Patient with history of same. Minimal erythema and induration overlying the abscess however ultrasound with distinct abscess. We'll plan for incision and drainage.  Patient noted to be hypertensive in the emergency department.  No signs of hypertensive urgency. Patient reports that she is out of her blood pressure medication. She denies headache, chest pain, shortness of breath. She reports it is high because she is in pain.  Discussed with patient the need for close follow-up and management by their primary care physician.   9:00 AM Patient with skin abscess amenable to incision and drainage.  Abscess was large enough to warrant packing,  wound recheck in 2 days. Encouraged home warm soaks and compresses.  Minimal signs of cellulitis is surrounding skin.  Will d/c to home.  Abx due to area of abscess.  I have personally reviewed patient's vitals, nursing note and any pertinent labs or imaging.  I performed an focused physical exam; undressed when appropriate .    It has been determined that no acute conditions requiring further emergency intervention are present at this time. The patient/guardian have been advised of the diagnosis and plan. I reviewed any labs and imaging including any potential incidental findings. We have discussed signs and symptoms that warrant return to the ED and they are listed in the discharge instructions.    Vital signs are stable at discharge.   BP 157/115 mmHg  Pulse 104  Temp(Src) 99.2 F (37.3 C) (Oral)  Resp 18  Ht  5\' 3"  (1.6 m)  Wt 220 lb (99.791 kg)  BMI 38.98 kg/m2  SpO2 100%  LMP 06/17/2014         Dierdre Forth, PA-C 07/21/14 [id number]  Layla Maw Ward, DO 07/21/14 1308

## 2014-07-21 NOTE — ED Notes (Signed)
Pt reports cyst between right and left buttock. Pt reports history same. Pt denies any fever,n/v.

## 2014-07-21 NOTE — Discharge Instructions (Signed)
1. Medications: clindamycin, vicodin, usual home medications 2. Treatment: rest, drink plenty of fluids, warm compresses 3. Follow Up: Please followup with this ER in 2 days for wound check; if you do not have a primary care doctor use the resource guide provided to find one; Please return to the ER earlier for fevers, chills, persistent vomiting   Abscess Care After An abscess (also called a boil or furuncle) is an infected area that contains a collection of pus. Signs and symptoms of an abscess include pain, tenderness, redness, or hardness, or you may feel a moveable soft area under your skin. An abscess can occur anywhere in the body. The infection may spread to surrounding tissues causing cellulitis. A cut (incision) by the surgeon was made over your abscess and the pus was drained out. Gauze may have been packed into the space to provide a drain that will allow the cavity to heal from the inside outwards. The boil may be painful for 5 to 7 days. Most people with a boil do not have high fevers. Your abscess, if seen early, may not have localized, and may not have been lanced. If not, another appointment may be required for this if it does not get better on its own or with medications. HOME CARE INSTRUCTIONS   Only take over-the-counter or prescription medicines for pain, discomfort, or fever as directed by your caregiver.  When you bathe, soak and then remove gauze or iodoform packs at least daily or as directed by your caregiver. You may then wash the wound gently with mild soapy water. Repack with gauze or do as your caregiver directs. SEEK IMMEDIATE MEDICAL CARE IF:   You develop increased pain, swelling, redness, drainage, or bleeding in the wound site.  You develop signs of generalized infection including muscle aches, chills, fever, or a general ill feeling.  An oral temperature above 102 F (38.9 C) develops, not controlled by medication. See your caregiver for a recheck if you  develop any of the symptoms described above. If medications (antibiotics) were prescribed, take them as directed. Document Released: 01/27/2005 Document Revised: 10/03/2011 Document Reviewed: 09/24/2007 [facility] Patient Information 2015 [place], [place]. This information is not intended to replace advice given to you by your health care provider. Make sure you discuss any questions you have with your health care provider.    Emergency Department Resource Guide 1) Find a Doctor and Pay Out of Pocket Although you won't have to find out who is covered by your insurance plan, it is a good idea to ask around and get recommendations. You will then need to call the office and see if the doctor you have chosen will accept you as a new patient and what types of options they offer for patients who are self-pay. Some doctors offer discounts or will set up payment plans for their patients who do not have insurance, but you will need to ask so you aren't surprised when you get to your appointment.  2) Contact Your Local Health Department Not all health departments have doctors that can see patients for sick visits, but many do, so it is worth a call to see if yours does. If you don't know where your local health department is, you can check in your phone book. The CDC also has a tool to help you locate your state's health department, and many state websites also have listings of all of their local health departments.  3) Find a Walk-in Clinic If your illness is not likely  to be very severe or complicated, you may want to try a walk in clinic. These are popping up all over the country in pharmacies, drugstores, and shopping centers. They're usually staffed by nurse practitioners or physician assistants that have been trained to treat common illnesses and complaints. They're usually fairly quick and inexpensive. However, if you have serious medical issues or chronic medical problems, these are probably not your best  option.  No Primary Care Doctor: - Call Health Connect at  [phone] - they can help you locate a primary care doctor that  accepts your insurance, provides certain services, etc. - Physician Referral Service- [phone]  Chronic Pain Problems: Organization         Address  Phone   Notes  [facility]  [phone] Patients need to be referred by their primary care doctor.   Medication Assistance: Organization         Address  Phone   Notes  [facility] Medication [facility] [address] [place] [place], [place] [address] [phone] --Must be a resident of [facility] -- Must have NO insurance coverage whatsoever (no Medicaid/ Medicare, etc.) -- The pt. MUST have a primary care doctor that directs their care regularly and follows them in the community   [facility]  [phone]   [employer]  [phone]    Agencies that provide inexpensive medical care: Organization         Address  Phone   Notes  [facility]  [phone]   [facility]    [phone]   [facility] [address] [place], [place] [address] [phone]   [facility] [address] [place] [place] [address], [place] [phone]   Planned Parenthood    [phone]   [facility]    [phone]   [facility] and [facility]  [address], [place] Phone:  [phone], Fax:  [phone] Hours of Operation:  9 am - 6 pm, M-F.  Also accepts Medicaid/Medicare and self-pay.  [facility]  [address], [place] Phone: [phone], Fax: [phone]. Hours of Operation:  8:30 am - 5:30 pm, M-F.  Also accepts Medicaid and self-pay.  [facility] [place] [address], [place] Phone: [phone]   [facility] [address] Natasha Bence [place], [place] [phone], Ext. [phone] Mondays & Thursdays: 7-9 AM.  First 15  patients are seen on a first come, first serve basis.    Medicaid-accepting [facility] Providers:  Organization         Address  Phone   Notes  [facility] [address], [place] [phone] Also accepts self-pay patients.  [facility] [address] Laurell Josephs [place], [place]  [phone]   [facility] [address], [place] [phone]   [facility] [address], [place] [phone]   Renaye Rakers [address], [place]   [phone] Only accepts [place] patients after they have their name applied to their card.   Self-Pay (no insurance) in [facility]:  Organization         Address  Phone   Notes  Sickle Cell Patients, [facility] [address] [place], [place] [phone]   [facility]gent [facility] [address], [place] [phone]   [facility]  [address], [place] [phone]   [facility]/Dr. Osei-Bonsu  [address] [place] [address], [place] or [phone], [place] [phone] Phone number for both [place] and [place] locations is the same.  [facility]gent [facility] and [facility] [address], [place] [phone]   [facility] [address] [place] [address], [place] or [address] [phone]   [facility] [address], [place] [phone], phone; [phone], fax Sees patients 1st and 3rd Saturday of every month.  Must not qualify for public or private insurance (i.e. Medicaid, Medicare, [place] Health Choice, Veterans' Benefits)  Household income should be no more than 200% of the poverty level The clinic cannot treat you if you are pregnant or think you are pregnant  Sexually transmitted diseases are not treated at the clinic.    Dental  Care: Organization         Address  Phone  Notes  [facility] [address], [place] [phone] Accepts children up to age 28 who are enrolled in [place]icaid or [place] Health Choice; pregnant women with a Medicaid card; and children who have applied for Medicaid or [place] Health Choice, but were declined, whose parents can pay a reduced fee at time of service.  [facility] [place]  [address], [place] [phone] Accepts children up to age 28 who are enrolled in [place] or [place] Health Choice; pregnant women with a Medicaid card; and children who have applied for Medicaid or  Health Choice, but were declined, whose parents can pay a reduced fee at time of service.  [facility]  [address], [place] [phone] Patients are seen by appointment only. Walk-ins are not accepted. [facility] will see patients 28 years of age and older. Monday - Tuesday (8am-5pm) Most Wednesdays (8:30-5pm) $30 per visit, cash only  [facility]  [address], [place] [phone] Patients are seen by appointment only. Walk-ins are not accepted. [facility] will see patients 28 years of age and older. One Wednesday Evening (Monthly: Volunteer Based).  $30 per visit, cash only  [employer] [facility] [facility]  [phone] for adults; Children under age 634, call Graduate Pediatric Dentistry at [phone]. Children aged 754-14, please call [phone] to request a pediatric application.  Dental services are provided in all areas of dental care including fillings, crowns and bridges, complete and partial dentures, implants, gum treatment, root canals, and extractions. Preventive care is also provided. Treatment is provided to both adults and children. Patients are selected via a lottery and there is often a waiting list.   [facility] [address], [place]  [phone] [email or web address]   [facility] [address], [place], [place] [phone], Ext. [phone] Second and Fourth Thursday of each month, opens at 6:30 AM; Clinic ends at 9 AM.  Patients are seen on a first-come first-served basis, and a limited number are seen during each clinic.   [facility]  [address], [place], [place] [phone]   Eligibility Requirements You must have lived in [place], [place], or [place] counties for at least the last three months.  You cannot be eligible for state or federal sponsored National Cityhealthcare insurance, including CIGNAVeterans Administration, [place]icaid, or [employer]icare.   You generally cannot be eligible for healthcare insurance through your employer.    How to apply: Eligibility screenings are held every Tuesday and Wednesday afternoon from 1:00 pm until 4:00 pm. You do not need an appointment for the interview!  [facility] [address], [place], [place] [phone]   [facility]  [phone]   [facility]  [phone]   [facility]  [phone]    Behavioral Health Resources in the Community: Intensive Outpatient Programs Organization         Address  Phone  Notes  [facility] [address], [place], [place] [phone]   [facility] Outpatient [address], [place], [place] [phone]   [facility] [address], [place], [place]  [phone]   [facility] [address],  [place], [place] [phone] or [phone]   Substance Abuse Resources Organization         Address  Phone  Notes  [facility]  [phone]   [facility]  [phone]   [facility]  [phone]   [facility]  [phone]   Residential & Outpatient Substance Abuse Program  [phone]    Psychological Services Organization         Address  Phone  Notes  [facility]  [phone]   [facility]  [phone]- [phone]   [facility] [address], [place] [phone] or [phone]    Mobile Crisis Teams Organization         Address  Phone  Notes  [facility], Mobile Crisis Care Unit  [phone]   [facility]  [address] [place], [place] [phone]   Doristine LocksSharon DeEsch [address] [place] [phone]    Self-Help/Support Groups Organization         Address  Phone             Notes  [facility] [place] - variety of support groups  [phone] Call for more information  Narcotics Anonymous (NA), [facility] [address], [place]  2 meetings at this location   Statisticianesidential Treatment Programs Organization         Address  Phone  Notes  [facility] [address],    [place]  [phone]   [facility]  [address], [place] [phone], [place], [place] [phone]   [facility] [address], [place] [phone] Admissions: 8am-3pm M-F  [facility] [address]    [place], [place] [phone]   [facility] [address], [place], [place] [phone]   [facility] [address]  [place], [place] [phone]   [facility] - Intensive Outpatient [address], [place], [place] [phone]   [facility] ([facility] [address]  [place], [place] [phone] or [phone]   [facility] ([facility]) [address] [place], [place] [phone] Accepts Medicaid  [facility] [address]  [place] [phone] Substance Abuse/Addiction Treatment   [facility] Resources Organization         Address  Phone  Notes  [facility]  [phone]   Angie FavaJulie Brannon, PhD [phone] [address]  RdDuanne Moron, [place]   [phone] or [phone]   [facility]   [address] [place], [place] [phone]   [facility] [address], [place], [place] [phone] Insurance/Medicaid/sponsorship through [facility] Families [address]                                    [place], [place] [phone] Therapy/tele-psych/case  [facility] [address], [place] [phone]    Dr. Lolly Mustache  [phone]   [facility]  [employer] [facility] Dept. 1) [address] [place] 2) [address], [place] 3)  [address], [place] [phone]  [phone]   [facility] Child Abuse Hotline [phone] or [phone] (After Hours)

## 2014-10-05 ENCOUNTER — Emergency Department: Payer: Self-pay | Admitting: Student

## 2014-10-30 ENCOUNTER — Emergency Department
Admit: 2014-10-30 | Disposition: A | Discharge status: Home / Self Care | Payer: Self-pay | Admitting: Emergency Medicine

## 2014-11-14 NOTE — Discharge Summary (Signed)
PATIENT NAME:  Heather Johnston, Heather Johnston MR#:  [id number] DATE OF BIRTH:  [birth year]  DATE OF ADMISSION:  09/23/2012 DATE OF DISCHARGE:    CHIEF COMPLAINT: Edema of the right lower extremity with some pain.   PRIMARY CARE PHYSICIAN: None   DISCHARGE DIAGNOSES: 1.  Erysipelas and cellulitis of the right lower extremity.  2.  Acute renal failure.  3.  Systemic inflammatory response syndrome criteria likely secondary to the cellulitis.   DISCHARGE MEDICATIONS: Acetaminophen/hydrocodone 325/5 mg 1 tab every 6 to 8 hours as needed for pain, clindamycin 300 mg 1 cap 3 times a day for 10 days.   DIET: Low sodium.   ACTIVITY: As tolerated.   FOLLOWUP: Please follow with a physician in 1 to 2 weeks if swelling persists, fever recurs, redness and pain persists or worsens or you are having multiple episodes of diarrhea.   DISPOSITION: Home.   HISTORY AND PHYSICAL: For full details of history and physical, please see the dictation on 03/02 by Dr. 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 The patient filled the clindamycin and took 1 tablet, but as she had progression of her redness and the pain tracking to her lower  leg from the upper thigh area, presented to the hospital and was noted to have low-grade fever and tachycardia. She was also noted to have acute renal failure and was admitted to the hospitalist service for further evaluation and management.   HOSPITAL COURSE: Blood cultures were sent, and she was started on vancomycin as THE PATIENT HAS A PENICILLIN ALLERGY. She had a white blood cell count of 11,000 on arrival which was lower than 18,000 which was on 02/28, of note. The patient was also noted to have renal failure with a  creatinine of 1.6 on admission which trended down to 1.2. She was started on IV fluids, had blood cultures taken which are no growth to date. Her pain has significantly improved.  The renal failure has resolved, and cellulitis has resolved on the thigh area which was tracking lymphatically to the lower leg. There is some redness and swelling and tenderness below the knee area on the right but has significantly improved. Hot compresses were applied, and at this point as the SIRS criteria have reversed, and the patient feels better, she will be discharged with clindamycin and some pain medicines. It was explained to her that if she has recurrent fevers, redness in the thigh area or feels worse, come back or see a physician, and she is aware that. However, I think that she did not give the clindamycin initially a chance to work, and I doubt it is clindamycin failure at this point, but she is aware that she should follow up with a doctor if she feels worse in any way.   CODE STATUS:  The patient is a FULL CODE.     TOTAL TIME SPENT: 35 minutes.   ____________________________ Krystal EatonShayiq Ahmadzia, MD sa:cb D: 09/25/2012 13:37:20 ET T: 09/25/2012 13:44:58 ET JOB#: [id number]  cc: Krystal EatonShayiq Ahmadzia, MD, <Dictator> Krystal EatonSHAYIQ AHMADZIA MD ELECTRONICALLY SIGNED 10/01/2012 13:22

## 2014-11-14 NOTE — H&P (Signed)
PATIENT NAME:  Heather Johnston, Martia MR#:  [id number] DATE OF BIRTH:  1986-04-29  DATE OF ADMISSION:  09/23/2012  REFERRING PHYSICIAN:  Sheryl L. Mindi JunkerGottlieb, MD   PRIMARY CARE PHYSICIAN:  The patient does not have one.   CHIEF COMPLAINT:  Edema of the right lower extremity with erythema.   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 The patient denies having any kidney issues at this moment. She came about 4 days ago with a history of edema of the lower extremity. At that moment, she had a white count of 18,000. She was discharged home on clindamycin. She comes today 4 days after with worsening edema of the right lower extremity from the knee down to her feet and significant erythema that now has spread down to the level of her groin. The patient states that the pain is 4 or 5 out of 10 and is significant especially on the level of the feet and calf muscles. The patient states that she has been having fevers on and off, although she has not been able to quantify her temperature because she does not own a thermometer. Her temperature right here the highest was 100 degrees. She has been really tachycardic with a pulse of 118 and has been very tired and very weak. She states that she has been only sleeping for the past 48 hours and she has not eaten anything or drank any fluids. The patient comes to the ER to be evaluated again. She is very dehydrated. She had an ultrasound Doppler of the right lower extremity that did not show any blood clots. The patient is admitted for failure of treatment of cellulitis that now has become an [place] REVIEW OF SYSTEMS:  CONSTITUTIONAL: Positive fever. Positive fatigue. Positive weakness. Positive pain of the lower extremity. No weight loss or weight gain.  EYES: No blurry vision,  double vision or inflammation.  ENT: No difficulty swallowing. No tinnitus. No nasal discharge.  RESPIRATORY: No cough. No wheezing. No hemoptysis.  CARDIOVASCULAR: No chest pain, orthopnea, arrhythmias, palpitations or syncope.  GASTROINTESTINAL: No nausea, vomiting, abdominal pain, constipation or diarrhea. No bloody stools or melena.  GENITOURINARY: No dysuria, hematuria, changes in urinary frequency.  GYNECOLOGIC: The patient denies any vaginal discomfort. She denies any vaginal secretions, although there is wet prep that was done. She denies any symptoms and she does not know why that was done.  ENDOCRINOLOGY: No polyuria, polydipsia or polyphagia. No cold or heat intolerance. No history of diabetes or thyroid disease.  HEMATOLOGIC/LYMPHATIC: No anemia, easy bruising, bleeding. No swollen glands.  SKIN: Without any major skin lesions. Positive rash as mentioned above.  MUSCULOSKELETAL: No significant neck pain, back pain, shoulder pain, knee pain or hip pain. No swelling of joints. No gout.  NEUROLOGIC: No numbness, tingling. No CVAs or TIAs.  PSYCHIATRIC: No insomnia, depression or nervousness.   PAST MEDICAL HISTORY:  Positive history of kidney abnormality likely at birth for which the patient had surgery. She could not tell me the specifics of it. 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 No other medical problems.   ALLERGIES:  PENICILLIN AND BACTRIM, THEY BOTH GIVE HER RASHES AND HIVES.   MEDICATIONS:  None. The patient was discharged last time from the ER on clindamycin.   SOCIAL HISTORY:  The patient is a smoker. She smokes 10 cigarettes a day, but she has decided to quit. Smoking cessation counseling done for 5 minutes. The patient states that she will stop smoking. She does not drink often, only once every couple of weeks. She works as a Conservation officer, nature. She lives with her sister. She denies any other drug use.   FAMILY HISTORY:  Positive for diabetes and hypertension in her  grandmother. No cancer. No coronary artery disease or MIs.   PHYSICAL EXAMINATION: VITAL SIGNS: Blood pressure of 112/58, pulse 118, temperature 100 degrees, respirations 18 and oxygen saturation 100% on room air.  GENERAL: Alert and oriented x3. No acute distress. No respiratory distress. The patient looks tired.  HEENT: Anicteric sclerae. Pink conjunctivae. Pupils are equal and reactive. Extraocular movements are intact. Mucosa is very dry and patient actually has a thick white cluster on top of her tongue. No other oral lesions. No erythema of the oropharynx.  NECK: Supple. No JVD. No thyromegaly. No adenopathy. No carotid bruits. No rigidity.  CARDIOVASCULAR: Regular rate and rhythm. No murmurs, rubs or gallops. The patient is tachycardic. No displacement of PMI. No tenderness to palpation of anterior chest wall.  LUNGS: Clear without any wheezing or crepitus. No use of accessory muscles.  ABDOMEN: Soft, nontender, nondistended. No hepatosplenomegaly. No masses. Bowel sounds are positive.  GENITAL: Deferred.  EXTREMITIES: Positive significant edema, pitting, +3 of the right lower extremity with significant erythema from the knee down and is striking of erythema with lymphatic flow of the thigh on the middle area of the thigh. The patient has significant tenderness to palpation at the level of the calf and foot, but no signs of abscesses or crepitation. Denna Haggard is positive, but her ultrasound of the lower extremity is negative.  NEUROLOGIC: Cranial nerves II through XII are intact. Strength is 5 out of 5 in 4 extremities.  PSYCHIATRIC: Mood: Very flat affect. The patient denies any depression.  SKIN: As mentioned above. Right lower extremity has a rash, but no other rashes on any other parts of her body.  LYMPHATICS: Negative for lymphadenopathy in the neck, supraclavicular areas or inguinal.  MUSCULOSKELETAL: No significant joint effusion or signs of gout.   DIAGNOSTIC DATA:  Her glucose is 102,  creatinine is 1.66, sodium 137, potassium 4.0. White blood count is 11.4, but it was 18,000 four days ago, hemoglobin is 12.4 and platelets are 175. Urinalysis was negative a couple of days ago. Ultrasound of the lower extremity showed no DVT.   ASSESSMENT AND PLAN:  A 29 year old female with cellulitis which has repeated from previous events, history of kidney surgery when she was a child who comes with cellulitis that has become erysipelas.  1.  Erysipelas. The patient has raised skin and streaking of her skin and there is a clear edge of diferentiation in between affected skin and normal skin is and the patient has systemic response syndrome with fever and elevated white blood count which is characteristic of erysipelas. At this moment, the main contenders are methicillin-resistant Staphylococcus aureus and hemolytic Streptococcus. THE PATIENT IS ALLERGIC TO PENICILLIN and she is already been started on vancomycin for which I am going continue vancomycin. If there is no improvement with the vancomycin alone, we will consider ceftriaxone. THE PATIENT IS ALLERGIC TO PENICILLIN, but she might not be to cephalosporins. No signs of abscess, but if the swelling continues consider getting a general surgery evaluation in case there is some formation of abscess. Her white count is actually improving,  but the patient is very symptomatic and is febrile. Continue vancomycin. Check a vancomycin level in 24 hours to see if she can have another dose since she has acute kidney injury.  2.  Acute kidney injury. The patient looks very dehydrated. Has not been eating or drinking for which we are going to put on intravenous fluids. Likely, this is related to the septic process and prerenal due to dehydration and poor oral intake. We are going to monitor her creatinine and follow up levels. Likely, she could have the beginnings of acute tubular necrosis.  3.  Systemic inflammatory response syndrome. The patient has systemic  inflammatory response syndrome due to the cellulitis and evidence of tachycardia, fever and increased white blood cells.  4.  Thrush. The patient has thrush. She is not diabetic or at least she told me she is not a diabetic. There is no other reason why she could have thrush. Maybe it is the antibiotics, but I am going to check a hemoglobin A1c and HIV test.  5.  Other medical problems seem to be stable. At this moment, we are going to admit the patient, treatment for severe cellulitis and now erysipelas with no improvement. Consider change antibiotics drastically especially to cover beta-hemolytic streptococcus.  6.  The patient is a full code.  7.  Gastrointestinal prophylaxis with proton pump inhibitor.  8.  Deep vein thrombosis prophylaxis with heparin. Since the patient has significant edema, consider repeating ultrasound of the right lower extremity if no improvement of the symptoms. We are going to keep her leg elevated.   TIME SPENT:  I spent about 45 minutes with this admission.    ____________________________ Felipa Furnace, MD rsg:si D: 09/23/2012 20:31:00 ET T: 09/23/2012 22:46:32 ET JOB#: [id number]  cc: Felipa Furnace, MD, <Dictator> Vamsi Apfel Juanda Chance MD ELECTRONICALLY SIGNED 10/09/2012 12:56

## 2015-01-03 ENCOUNTER — Encounter: Payer: Self-pay | Admitting: Emergency Medicine

## 2015-01-03 ENCOUNTER — Emergency Department
Admission: EM | Admit: 2015-01-03 | Discharge: 2015-01-03 | Disposition: A | Discharge status: Home / Self Care | Payer: Self-pay | Attending: Student | Admitting: Student

## 2015-01-03 DIAGNOSIS — Z792 Long term (current) use of antibiotics: Secondary | ICD-10-CM | POA: Insufficient documentation

## 2015-01-03 DIAGNOSIS — I890 Lymphedema, not elsewhere classified: Secondary | ICD-10-CM | POA: Insufficient documentation

## 2015-01-03 DIAGNOSIS — Z79899 Other long term (current) drug therapy: Secondary | ICD-10-CM | POA: Insufficient documentation

## 2015-01-03 DIAGNOSIS — Z880 Allergy status to penicillin: Secondary | ICD-10-CM | POA: Insufficient documentation

## 2015-01-03 DIAGNOSIS — Z720 Tobacco use: Secondary | ICD-10-CM | POA: Insufficient documentation

## 2015-01-03 MED ORDER — OXYCODONE HCL 5 MG PO TABS: 5.0000 mg | ORAL_TABLET | Freq: Four times a day (QID) | ORAL | Status: DC | PRN

## 2015-01-03 NOTE — Discharge Instructions (Signed)
Lymphedema Lymphedema is a swelling caused by the abnormal collection of lymph under the skin. The lymph is fluid from the tissues in your body that travels in the lymphatic system. This system is part of the immune system that includes lymph nodes and vessels. The lymph vessels collect and carry the excess fluid, fats, proteins, and wastes from the tissues of the body to the bloodstream. This system also works to clean and remove bacteria and waste products from the body.  Lymphedema occurs when the lymphatic system is blocked. When the lymph vessels or lymph nodes are blocked or damaged, lymph does not drain properly. This causes abnormal build up of lymph. This leads to swelling in the arms or legs. Lymphedema cannot be cured by medicines. But the swelling can be reduced by physical methods. CAUSES  There are two types of lymphedema. Primary lymphedema is caused by the absence or abnormality of the lymph vessel at birth. It is also known as inherited lymphedema, which occurs rarely. Secondary or acquired lymphedema occurs when the lymph vessel is damaged or blocked. The causes of lymph vessel blockage are:   Skin infection like cellulites.  Infection by parasites (filariasis).  Injury.  Cancer.  Radiation therapy.  Formation of scar tissue.  Surgery. SYMPTOMS  The symptoms of lymphedema are:  Abnormal swelling of the arm or leg.  Heavy or tight feeling in your arm or leg.  Tight-fitting shoes or rings.  Redness of skin over the affected area.  Limited movement of the affected limb.  Some patients complain about sensitivity to touch and discomfort in the limb(s) affected. You may not have these symptoms immediately following injury. They usually appear within a few days or even years after injury. Inform your caregiver, if you have any of these symptoms. Early treatment can avoid further problems.  DIAGNOSIS  First, your caregiver will inquire about any surgery you have had or  medicines you are taking. He will then examine you. Your caregiver may order special imaging tests, such as:  Lymphoscintigraphy (a test in which a low dose of radioactive substance is injected to trace the flow of lymph through the lymph vessels).  MRI (imaging tests using magnetic fields).  Computed tomography (test using special cross-sectional X-rays).  Duplex ultrasound (test using high-frequency sound waves to show the vessels and the blood flow on a screen).  Lymphangiography (special X-ray taken after injecting a contrast dye into the lymph vessel). It is now rarely done. TREATMENT  Lymphedema can be treated in different ways. Your caregiver will decide the type of treatment depending on the cause. Treatment may include:  Exercise: Special exercises will help fluid move out easily from the affected part. This should be done as per your caregiver's advice.  Manual lymph drainage: Gentle massage of the affected limb makes the fluid to move out more freely.  Compression: Compression stockings or external pump apply pressure over the affected limb. This helps the fluid to move out from the arm or leg. Bandaging can also help to move the fluid out from the affected part. Your caregiver will decide the method that suits you the best.  Medicines: Your caregiver may prescribe antibiotics, if you have infection.  Surgery: Your caregiver may advise surgery for severe lymphedema. It is reserved for special cases when the patient has difficulty moving. Your surgeon may remove excess tissue from the arm or leg. This will help to ease your movement. Physical therapy may have to be continued after surgery. HOME CARE INSTRUCTIONS    The area is very fragile and is predisposed to injury and infection.  Eat a healthy diet.  Exercise regularly as per advice.  Keep the affected area clean and dry.  Use gloves while cooking or gardening.  Protect your skin from cuts.  Use electric razor to  shave the affected area.  Keep affected limb elevated.  Do not wear tight clothes, shoes, or jewelry as it may cause the tissue to be strangled.  Do not use heat pads over the affected area.  Do not sit with cross legs.  Do not walk barefoot.  Do not carry weight on the affected arm.  Avoid having blood pressure checked on the affected limb. SEEK MEDICAL CARE IF:  You continue to have swelling in your limb. SEEK IMMEDIATE MEDICAL CARE IF:   You have high fever.  You have skin rash.  You have chills or sweats.  You have pain or redness.  You have a cut that does not heal. MAKE SURE YOU:   Understand these instructions.  Will watch your condition.  Will get help right away if you are not doing well or get worse. Document Released: 05/08/2007 Document Revised: 06/27/2012 Document Reviewed: 04/13/2009 Exit[employer] Patient Information 2015 [employer]. This information is not intended to replace advice given to you by your health care provider. Make sure you discuss any questions you have with your health care provider.  

## 2015-01-03 NOTE — ED Provider Notes (Addendum)
[facility] Emergency Department Provider Note  ____________________________________________  Time seen: Approximately 5:50 AM  I have reviewed the triage vital signs and the nursing notes.   HISTORY  Chief Complaint Foot Swelling    HPI Heather Johnston is a 29 y.o. 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 Patient reports that she has chronic swelling in both legs however it has become more severe yesterday. She reports this typically occurs when she has been standing on her feet for long time. This is consistent with her usual lymphedema swelling exacerbation which is also accompanied by pain in the legs. Onset gradual. Current severity is moderate. Symptoms have been constant. Rest and elevation generally help alleviate the swelling. She denies any chest pain or difficulty breathing and she denies any history of DVT or PE. She reports she has had several ultrasounds of the right leg and there has never been any evidence of DVT.   Past Medical History  Diagnosis Date  . Lymphedema     There are no active problems to display for this patient.   Past Surgical History  Procedure Laterality Date  . Kidney surgery      Current Outpatient Rx  Name  Route  Sig  Dispense  Refill  . clindamycin (CLEOCIN) 150 MG capsule   Oral   Take 3 capsules (450 mg total) by mouth 3 (three) times daily.   90 capsule   0   . HYDROcodone-acetaminophen (NORCO/VICODIN) 5-325 MG per tablet   Oral   Take 1 tablet by mouth every 6 (six) hours as needed for moderate pain or severe pain.   10 tablet   0   . Multiple Vitamin (MULTIVITAMIN WITH MINERALS) TABS tablet   Oral   Take 1 tablet by mouth daily.         . naproxen (NAPROSYN) 500 MG tablet   Oral   Take 1 tablet (500 mg total) by mouth 2 (two) times daily.   14 tablet   0      Allergies Bactrim and Penicillins  No family history on file.  Social History History  Substance Use Topics  . Smoking status: Current Every Day Smoker -- 0.50 packs/day for 10 years    Types: Cigarettes  . Smokeless tobacco: Not on file  . Alcohol Use: Yes     Comment: occ    Review of Systems Constitutional: No fever/chills Eyes: No visual changes. ENT: No sore throat. Cardiovascular: Denies chest pain. Respiratory: Denies shortness of breath. Gastrointestinal: No abdominal pain.  No nausea, no vomiting.  No diarrhea.  No constipation. Genitourinary: Negative for dysuria. Musculoskeletal: Negative for back pain. Skin: Negative for rash. Neurological: Negative for headaches, focal weakness or numbness.  10-point ROS otherwise negative.  ____________________________________________   PHYSICAL EXAM:   Filed Vitals:   01/03/15 0537 01/03/15 0608  BP: 169/107 152/92  Pulse: 103 98  Temp: 98.6 F (37 C)   TempSrc: Oral   Resp: 18 18  Height: 5\' 3"  (1.6 m)   Weight: 220 lb (99.791 kg)   SpO2: 98%    VITAL SIGNS: ED Triage Vitals  Enc Vitals Group     BP 01/03/15 0537 169/107 mmHg     Pulse Rate 01/03/15 0537 103     Resp 01/03/15 0537 18     Temp 01/03/15 0537 98.6 F (37 C)     Temp Source 01/03/15 0537  Oral     SpO2 01/03/15 0537 98 %     Weight 01/03/15 0537 220 lb (99.791 kg)     Height 01/03/15 0537  (1.6 m)     Head Cir --      Peak Flow --      Pain Score 01/03/15 0538 8     Pain Loc --      Pain Edu? --      Excl. in GC? --     Constitutional: Alert and oriented. Well appearing and in no acute distress. Eyes: Conjunctivae are normal. PERRL. EOMI. Head: Atraumatic. Nose: No congestion/rhinnorhea. Mouth/Throat: Mucous membranes are moist.  Oropharynx non-erythematous. Neck: No stridor.  Cardiovascular: Normal rate, regular rhythm. Grossly normal heart sounds.  Good peripheral circulation. Respiratory: Normal respiratory effort.  No  retractions. Lungs CTAB. Gastrointestinal: Soft and nontender. No distention. No abdominal bruits. No CVA tenderness. Genitourinary: deferred Musculoskeletal: 2+ edema of the right lower extremity primarily involving the ankle and right foot, 1+ edema of the left lower extremity primarily involving the ankle and the left foot, both feet are tender to touch. Doppler DP pulse bilaterally, wiggles the toes, intact sensation to light touch. Neurologic:  Normal speech and language. No gross focal neurologic deficits are appreciated. Speech is normal. No gait instability. Skin:  Skin is warm, dry and intact. No rash noted. Psychiatric: Mood and affect are normal. Speech and behavior are normal.  ____________________________________________   LABS (all labs ordered are listed, but only abnormal results are displayed)  Labs Reviewed - No data to display ____________________________________________  EKG  none ____________________________________________  RADIOLOGY  none ____________________________________________   PROCEDURES  Procedure(s) performed: None  Critical Care performed: No  ____________________________________________   INITIAL IMPRESSION / ASSESSMENT AND PLAN / ED COURSE  Pertinent labs & imaging results that were available during my care of the patient were reviewed by me and considered in my medical decision making (see chart for details).  Heather Johnston is a 29 y.o. 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 History and physical consistent with her usual swelling associated with  flare of her lymphedema. She reports that this is generally treated with rest, pain medications and antibiotics as needed for cellulitis. I do not see any evidence of cellulitis on her exam. I doubt DVT as her symptoms are chronic and similar to her usual lymphedema flares.  We'll DC with short course of narcotic pain medications and PCP follow-up. She reports that she is in process of establishing care with a lymphedema specialist. Mildly tachycardic on arrival but this resolved spontaneously several minutes later without any intervention in the emergency department. ____________________________________________   FINAL CLINICAL IMPRESSION(S) / ED DIAGNOSES  Final diagnoses:  Lymphedema of both lower extremities      Gayla Doss, MD 01/03/15 1610  Gayla Doss, MD 01/03/15 [phone]

## 2015-01-03 NOTE — ED Notes (Signed)
Patient states has pain and swelling to bilateral feet that she states started last night.

## 2016-04-17 ENCOUNTER — Emergency Department: Payer: Self-pay

## 2016-04-17 ENCOUNTER — Emergency Department
Admission: EM | Admit: 2016-04-17 | Discharge: 2016-04-17 | Disposition: A | Discharge status: Home / Self Care | Payer: Self-pay | Attending: Emergency Medicine | Admitting: Emergency Medicine

## 2016-04-17 ENCOUNTER — Encounter: Payer: Self-pay | Admitting: Urgent Care

## 2016-04-17 DIAGNOSIS — F439 Reaction to severe stress, unspecified: Secondary | ICD-10-CM | POA: Insufficient documentation

## 2016-04-17 DIAGNOSIS — M7989 Other specified soft tissue disorders: Secondary | ICD-10-CM

## 2016-04-17 DIAGNOSIS — I10 Essential (primary) hypertension: Secondary | ICD-10-CM | POA: Insufficient documentation

## 2016-04-17 DIAGNOSIS — R609 Edema, unspecified: Secondary | ICD-10-CM

## 2016-04-17 DIAGNOSIS — F17210 Nicotine dependence, cigarettes, uncomplicated: Secondary | ICD-10-CM | POA: Insufficient documentation

## 2016-04-17 DIAGNOSIS — R600 Localized edema: Secondary | ICD-10-CM | POA: Insufficient documentation

## 2016-04-17 HISTORY — DX: Essential (primary) hypertension: I10

## 2016-04-17 LAB — BASIC METABOLIC PANEL
ANION GAP: 6 (ref 5–15)
BUN: 15 mg/dL (ref 6–20)
CALCIUM: 9.3 mg/dL (ref 8.9–10.3)
CO2: 27 mmol/L (ref 22–32)
CREATININE: 1.38 mg/dL — AB (ref 0.44–1.00)
Chloride: 107 mmol/L (ref 101–111)
GFR, EST AFRICAN AMERICAN: 59 mL/min — AB (ref >60)
GFR, EST NON AFRICAN AMERICAN: 51 mL/min — AB (ref >60)
Glucose, Bld: 94 mg/dL (ref 65–99)
Potassium: 3.6 mmol/L (ref 3.5–5.1)
Sodium: 140 mmol/L (ref 135–145)

## 2016-04-17 LAB — CBC
HCT: 37.3 % (ref 35.0–47.0)
Hemoglobin: 13.3 g/dL (ref 12.0–16.0)
MCH: 31.4 pg (ref 26.0–34.0)
MCHC: 35.7 g/dL (ref 32.0–36.0)
MCV: 88.1 fL (ref 80.0–100.0)
PLATELETS: 275 10*3/uL (ref 150–440)
RBC: 4.23 MIL/uL (ref 3.80–5.20)
RDW: 13.2 % (ref 11.5–14.5)
WBC: 7.8 10*3/uL (ref 3.6–11.0)

## 2016-04-17 MED ORDER — HYDROCHLOROTHIAZIDE 50 MG PO TABS: 50.0000 mg | ORAL_TABLET | Freq: Every day | ORAL | 0 refills | Status: DC

## 2016-04-17 MED ORDER — HYDROCHLOROTHIAZIDE 50 MG PO TABS
50.0000 mg | ORAL_TABLET | Freq: Once | ORAL | Status: AC
  Administered 2016-04-17: 50 mg via ORAL

## 2016-04-17 MED ORDER — HYDROCHLOROTHIAZIDE 50 MG PO TABS
ORAL_TABLET | ORAL | Status: AC
  Filled 2016-04-17: qty 1

## 2016-04-17 MED ORDER — CLONIDINE HCL 0.1 MG PO TABS
0.1000 mg | ORAL_TABLET | Freq: Once | ORAL | Status: AC
  Administered 2016-04-17: 0.1 mg via ORAL
  Filled 2016-04-17: qty 1

## 2016-04-17 MED ORDER — OXYCODONE-ACETAMINOPHEN 5-325 MG PO TABS: 1.0000 | ORAL_TABLET | ORAL | 0 refills | Status: DC | PRN

## 2016-04-17 MED ORDER — KETOROLAC TROMETHAMINE 30 MG/ML IJ SOLN
60.0000 mg | Freq: Once | INTRAMUSCULAR | Status: AC
  Administered 2016-04-17: 60 mg via INTRAMUSCULAR
  Filled 2016-04-17: qty 2

## 2016-04-17 MED ORDER — CLONIDINE HCL 0.1 MG PO TABS
0.1000 mg | ORAL_TABLET | Freq: Once | ORAL | Status: DC
Start: 2016-04-17 — End: 2016-04-17

## 2016-04-17 MED ORDER — DIAZEPAM 2 MG PO TABS: 2.0000 mg | ORAL_TABLET | Freq: Three times a day (TID) | ORAL | 0 refills | Status: DC | PRN

## 2016-04-17 MED ORDER — DIAZEPAM 2 MG PO TABS
2.0000 mg | ORAL_TABLET | Freq: Once | ORAL | Status: DC
  Filled 2016-04-17: qty 1

## 2016-04-17 NOTE — ED Provider Notes (Signed)
[facility] Emergency Department Provider Note   ____________________________________________   First MD Initiated Contact with Patient 04/17/16 0354     (approximate)  I have reviewed the triage vital signs and the nursing notes.   HISTORY  Chief Complaint Leg Swelling and Hypertension    HPI Heather Johnston is a 30 y.o. female presents to the ED from home with a chief complain of elevated blood pressure and BLE edema. Patient has a history of hypertension, on HCTZ 50 mg daily. Also has a history of lymphedema with RLE swollen for over a year. Patient reports for the past week she is having edema to both lower legs. States she has been overwhelmed with increased stress. When her blood pressure is elevated she has dizziness and headaches. None currently. Denies associatedfever, chills, vision changes, neck pain, chest pain, shortness of breath, abdominal pain, nausea, vomiting, diarrhea. Denies SI/HI/AH/VH. Denies recent travel or trauma. Nothing makes her symptoms better or worse.   Past Medical History:  Diagnosis Date  . Hypertension   . Lymphedema     There are no active problems to display for this patient.   Past Surgical History:  Procedure Laterality Date  . KIDNEY SURGERY      Prior to Admission medications   Medication Sig Start Date End Date Taking? Authorizing Provider  clindamycin (CLEOCIN) 150 MG capsule Take 3 capsules (450 mg total) by mouth 3 (three) times daily. 07/21/14   Hannah Muthersbaugh, PA-C  HYDROcodone-acetaminophen (NORCO/VICODIN) 5-325 MG per tablet Take 1 tablet by mouth every 6 (six) hours as needed for moderate pain or severe pain. 07/21/14   Hannah Muthersbaugh, PA-C  Multiple Vitamin (MULTIVITAMIN WITH MINERALS) TABS tablet Take 1 tablet by mouth daily.    Historical Provider, MD  naproxen (NAPROSYN) 500 MG tablet Take 1 tablet (500 mg total) by mouth 2 (two) times daily. 04/08/14   Vanetta Mulders, MD  oxyCODONE  (ROXICODONE) 5 MG immediate release tablet Take 1 tablet (5 mg total) by mouth every 6 (six) hours as needed for moderate pain. Do not drive while taking this medication. 01/03/15   Gayla Doss, MD    Allergies Bactrim [sulfamethoxazole-trimethoprim] and Penicillins  No family history on file.  Social History Social History  Substance Use Topics  . Smoking status: Current Every Day Smoker    Packs/day: 0.50    Years: 10.00    Types: Cigarettes  . Smokeless tobacco: Never Used  . Alcohol use Yes     Comment: occ    Review of Systems  Constitutional: No fever/chills. Eyes: No visual changes. ENT: No sore throat. Cardiovascular: Denies chest pain. Respiratory: Denies shortness of breath. Gastrointestinal: No abdominal pain.  No nausea, no vomiting.  No diarrhea.  No constipation. Genitourinary: Negative for dysuria. Musculoskeletal: Positive for BLE edema. Negative for back pain. Skin: Negative for rash. Neurological: Negative for headaches, focal weakness or numbness. Psychiatric:Positive for increased stress.  10-point ROS otherwise negative.  ____________________________________________   PHYSICAL EXAM:  VITAL SIGNS: ED Triage Vitals  Enc Vitals Group     BP 04/17/16 0059 (!) 156/117     Pulse Rate 04/17/16 0059 90     Resp 04/17/16 0059 18     Temp 04/17/16 0059 98.3 F (36.8 C)     Temp Source 04/17/16 0059 Oral     SpO2 04/17/16 0059 100 %     Weight 04/17/16 0100 210 lb (95.3 kg)     Height 04/17/16 0100 5\' 3"  (1.6 m)  Head Circumference --      Peak Flow --      Pain Score 04/17/16 0106 7     Pain Loc --      Pain Edu? --      Excl. in GC? --     Constitutional: Alert and oriented. Well appearing and in no acute distress. Eyes: Conjunctivae are normal. PERRL. EOMI. Head: Atraumatic. Nose: No congestion/rhinnorhea. Mouth/Throat: Mucous membranes are moist.  Oropharynx non-erythematous. Neck: No stridor.  No carotid bruits. Cardiovascular:  Normal rate, regular rhythm. Grossly normal heart sounds.  Good peripheral circulation. Respiratory: Normal respiratory effort.  No retractions. Lungs CTAB. Gastrointestinal: Soft and nontender. No distention. No abdominal bruits. No CVA tenderness. Musculoskeletal: BLE 1+ no pitting edema; right greater than left.  No joint effusions. Neurologic:  Normal speech and language. No gross focal neurologic deficits are appreciated.  Skin:  Skin is warm, dry and intact. No rash noted. Psychiatric: Mood and affect are normal. Speech and behavior are normal.  ____________________________________________   LABS (all labs ordered are listed, but only abnormal results are displayed)  Labs Reviewed  BASIC METABOLIC PANEL - Abnormal; Notable for the following:       Result Value   Creatinine, Ser 1.38 (*)    GFR calc non Af Amer 51 (*)    GFR calc Af Amer 59 (*)    All other components within normal limits  CBC   ____________________________________________  EKG  ED ECG REPORT I, SUNG,JADE J, the attending physician, personally viewed and interpreted this ECG.   Date: 04/17/2016  EKG Time: 0109  Rate: 94  Rhythm: normal EKG, normal sinus rhythm  Axis: Normal  Intervals:none  ST&T Change: Nonspecific  ____________________________________________  RADIOLOGY  BLE Doppler ultrasound interpreted per Dr. Andria Meuse: No evidence of deep venous thrombosis. ____________________________________________   PROCEDURES  Procedure(s) performed: None  Procedures  Critical Care performed: No  ____________________________________________   INITIAL IMPRESSION / ASSESSMENT AND PLAN / ED COURSE  Pertinent labs & imaging results that were available during my care of the patient were reviewed by me and considered in my medical decision making (see chart for details).  30 year old female with a history of hypertension and lymphedema who presents with elevated blood pressure and increased  bilateral lower leg edema; also reports increased stress this week. Laboratory results are unremarkable; Doppler ultrasound is negative for DVT. Will administer clonidine for elevated blood pressure and Valium for anxiety/stress.  Clinical Course  Comment By Time  BP continues to be elevated after clonidine. Patient driving so she was unable to take the Valium for her stress/anxiety. Will administer IM Toradol for leg pain and second clonidine. Irean Hong, MD 09/24 [phone]  Upon further questioning, patient admits that she has been out of her hydrochlorothiazide for the past month or more. Given this new information, I will replace the second clonidine with 50 mg HCTZ and refill the patient's prescription. Blood pressures have been consistent. Patient is asymptomatic and I suspect her blood pressures are typically in this range. I will avoid drastically lowering her blood pressure with further antihypertensives beyond HCTZ. She will follow up closely with her PCP early next week. Strict return precautions given. Patient verbalizes understanding and agrees with plan of care. Irean Hong, MD 09/24 [phone]  BP much improved after Toradol which was given for her leg pain. Patient feeling better. Irean Hong, MD 09/24 [phone]     ____________________________________________   FINAL CLINICAL IMPRESSION(S) / ED DIAGNOSES  Final  diagnoses:  Edema  Essential hypertension  Leg swelling  Stress      NEW MEDICATIONS STARTED DURING THIS VISIT:  New Prescriptions   No medications on file     Note:  This document was prepared using Dragon voice recognition software and may include unintentional dictation errors.    Irean HongJade J Sung, MD 04/17/16 [phone]

## 2016-04-17 NOTE — ED Notes (Signed)
Patient states that she has had swelling to left foot that started about a week ago. Patient states that she has lymphedema to right leg for 2 years and had an increase in swelling over the past week. Patient with positive pedal pulses.

## 2016-04-17 NOTE — ED Triage Notes (Signed)
Patient presents with c/o BLE edema. RLE has been swelling for over a year, however patient was Dx'd with lymphedema. Patient reports that her RLE is swelling more in her calf that normal. Patient also with edema to LLE x 1 week. Both feet/ankle painful and swollen in triage. BP elevated x 1 week; (+) dizziness and headaches. Patient reports that she is under a lot more stress.

## 2016-04-17 NOTE — ED Notes (Signed)
Brown,MD consulted. MD made aware of presenting complaints and triage assessment. MD with [facility] for CBC, BMP, and ultrasounds of her BLE. Orders to be entered and carried by this RN.

## 2016-04-17 NOTE — Discharge Instructions (Addendum)
1. Restart HCTZ 50 mg daily. 2. You may take Percocet as needed for pain. 3. Elevate legs whenever possible. Wear compression stockings as needed. 4. Return to the ER for worsening symptoms, persistent vomiting, difficulty breathing or other concerns.

## 2016-05-28 ENCOUNTER — Encounter: Payer: Self-pay | Admitting: Emergency Medicine

## 2016-05-28 ENCOUNTER — Emergency Department: Payer: Self-pay

## 2016-05-28 ENCOUNTER — Emergency Department
Admission: EM | Admit: 2016-05-28 | Discharge: 2016-05-28 | Disposition: A | Discharge status: Home / Self Care | Payer: Self-pay | Attending: Emergency Medicine | Admitting: Emergency Medicine

## 2016-05-28 DIAGNOSIS — N760 Acute vaginitis: Secondary | ICD-10-CM | POA: Insufficient documentation

## 2016-05-28 DIAGNOSIS — I10 Essential (primary) hypertension: Secondary | ICD-10-CM | POA: Insufficient documentation

## 2016-05-28 DIAGNOSIS — B9689 Other specified bacterial agents as the cause of diseases classified elsewhere: Secondary | ICD-10-CM

## 2016-05-28 DIAGNOSIS — F17210 Nicotine dependence, cigarettes, uncomplicated: Secondary | ICD-10-CM | POA: Insufficient documentation

## 2016-05-28 DIAGNOSIS — Z79899 Other long term (current) drug therapy: Secondary | ICD-10-CM | POA: Insufficient documentation

## 2016-05-28 DIAGNOSIS — R102 Pelvic and perineal pain: Secondary | ICD-10-CM

## 2016-05-28 LAB — URINALYSIS COMPLETE WITH MICROSCOPIC (ARMC ONLY)
Bacteria, UA: NONE SEEN
Bilirubin Urine: NEGATIVE
GLUCOSE, UA: NEGATIVE mg/dL
Hgb urine dipstick: NEGATIVE
KETONES UR: NEGATIVE mg/dL
Leukocytes, UA: NEGATIVE
NITRITE: NEGATIVE
Protein, ur: NEGATIVE mg/dL
SPECIFIC GRAVITY, URINE: 1.004 — AB (ref 1.005–1.030)
pH: 7 (ref 5.0–8.0)

## 2016-05-28 LAB — CHLAMYDIA/NGC RT PCR (ARMC ONLY)
Chlamydia Tr: NOT DETECTED
N gonorrhoeae: NOT DETECTED

## 2016-05-28 LAB — WET PREP, GENITAL
SPERM: NONE SEEN
TRICH WET PREP: NONE SEEN
YEAST WET PREP: NONE SEEN

## 2016-05-28 LAB — POCT PREGNANCY, URINE: Preg Test, Ur: NEGATIVE

## 2016-05-28 MED ORDER — METRONIDAZOLE 500 MG PO TABS
500.0000 mg | ORAL_TABLET | Freq: Once | ORAL | Status: AC
  Administered 2016-05-28: 500 mg via ORAL
  Filled 2016-05-28: qty 1

## 2016-05-28 MED ORDER — METRONIDAZOLE 500 MG PO TABS: 500.0000 mg | ORAL_TABLET | Freq: Two times a day (BID) | ORAL | 0 refills | Status: AC

## 2016-05-28 MED ORDER — IBUPROFEN 600 MG PO TABS: 600.0000 mg | ORAL_TABLET | Freq: Four times a day (QID) | ORAL | 0 refills | Status: DC | PRN

## 2016-05-28 NOTE — ED Provider Notes (Signed)
-----------------------------------------   7:28 AM on 05/28/2016 -----------------------------------------  Ultrasound is negative. Labs are largely within normal limits besides clue cells on wet prep. Patient will be discharged with Flagyl and PCP follow-up. Patient agreeable to plan.   Minna AntisKevin Kenda Kloehn, MD 05/28/16 [phone]

## 2016-05-28 NOTE — ED Provider Notes (Signed)
[facility] Emergency Department Provider Note    First MD Initiated Contact with Patient 05/28/16 0410     (approximate)  I have reviewed the triage vital signs and the nursing notes.   HISTORY  Chief Complaint Abdominal Pain   HPI Heather Johnston is a 30 y.o. female history of hypertension lymphedema presents with 1-1/2 week history of pelvic discomfort and vaginal spotting following intercourse. Patient states that her mother has a history of fibroids with diagnosis made when she was 30 years old. Patient states that her menses have been very "regular". Patient denies any fever no nausea vomiting or diarrhea.   Past Medical History:  Diagnosis Date  . Hypertension   . Lymphedema     There are no active problems to display for this patient.   Past Surgical History:  Procedure Laterality Date  . KIDNEY SURGERY      Prior to Admission medications   Medication Sig Start Date End Date Taking? Authorizing Provider  clindamycin (CLEOCIN) 150 MG capsule Take 3 capsules (450 mg total) by mouth 3 (three) times daily. 07/21/14   Hannah Muthersbaugh, PA-C  hydrochlorothiazide (HYDRODIURIL) 50 MG tablet Take 1 tablet (50 mg total) by mouth daily. 04/17/16   Irean HongJade J Sung, MD  HYDROcodone-acetaminophen (NORCO/VICODIN) 5-325 MG per tablet Take 1 tablet by mouth every 6 (six) hours as needed for moderate pain or severe pain. 07/21/14   Hannah Muthersbaugh, PA-C  Multiple Vitamin (MULTIVITAMIN WITH MINERALS) TABS tablet Take 1 tablet by mouth daily.    Historical Provider, MD  naproxen (NAPROSYN) 500 MG tablet Take 1 tablet (500 mg total) by mouth 2 (two) times daily. 04/08/14   Vanetta MuldersScott Zackowski, MD  oxyCODONE (ROXICODONE) 5 MG immediate release tablet Take 1 tablet (5 mg total) by mouth every 6 (six) hours as needed for moderate pain. Do not drive while taking this medication. 01/03/15   Gayla DossEryka A Gayle, MD  oxyCODONE-acetaminophen (ROXICET) 5-325 MG tablet Take 1 tablet  by mouth every 4 (four) hours as needed for severe pain. 04/17/16   Irean HongJade J Sung, MD    Allergies Bactrim [sulfamethoxazole-trimethoprim] and Penicillins  No family history on file.  Social History Social History  Substance Use Topics  . Smoking status: Current Every Day Smoker    Packs/day: 0.50    Years: 10.00    Types: Cigarettes  . Smokeless tobacco: Never Used  . Alcohol use Yes     Comment: occ    Review of Systems Constitutional: No fever/chills Eyes: No visual changes. ENT: No sore throat. Cardiovascular: Denies chest pain. Respiratory: Denies shortness of breath. Gastrointestinal: No abdominal pain.  No nausea, no vomiting.  No diarrhea.  No constipation. Genitourinary: Negative for dysuria. Positive for pelvic pain Musculoskeletal: Negative for back pain. Skin: Negative for rash. Neurological: Negative for headaches, focal weakness or numbness.  10-point ROS otherwise negative.  ____________________________________________   PHYSICAL EXAM:  VITAL SIGNS: ED Triage Vitals  Enc Vitals Group     BP 05/28/16 0038 (!) 143/91     Pulse Rate 05/28/16 0038 (!) 103     Resp 05/28/16 0038 16     Temp 05/28/16 0038 98.8 F (37.1 C)     Temp Source 05/28/16 0038 Oral     SpO2 05/28/16 0038 100 %     Weight 05/28/16 0039 220 lb (99.8 kg)     Height 05/28/16 0039 5\' 3"  (1.6 m)     Head Circumference --      Peak  Flow --      Pain Score 05/28/16 0039 8     Pain Loc --      Pain Edu? --      Excl. in GC? --     Constitutional: Alert and oriented. Well appearing and in no acute distress. Eyes: Conjunctivae are normal. PERRL. EOMI. Head: Atraumatic. Mouth/Throat: Mucous membranes are moist.  Oropharynx non-erythematous. Neck: No stridor.   Cardiovascular: Normal rate, regular rhythm. Good peripheral circulation. Grossly normal heart sounds. Respiratory: Normal respiratory effort.  No retractions. Lungs CTAB. Gastrointestinal: Soft and nontender. No distention.   Suprapubic tenderness to palpation Musculoskeletal: No lower extremity tenderness nor edema. No gross deformities of extremities. Neurologic:  Normal speech and language. No gross focal neurologic deficits are appreciated.  Skin:  Skin is warm, dry and intact. No rash noted. Psychiatric: Mood and affect are normal. Speech and behavior are normal.  ____________________________________________   LABS (all labs ordered are listed, but only abnormal results are displayed)  Labs Reviewed  WET PREP, GENITAL - Abnormal; Notable for the following:       Result Value   Clue Cells Wet Prep HPF POC PRESENT (*)    WBC, Wet Prep HPF POC FEW (*)    All other components within normal limits  URINALYSIS COMPLETEWITH MICROSCOPIC ([facility] ONLY) - Abnormal; Notable for the following:    Color, Urine STRAW (*)    APPearance CLEAR (*)    Specific Gravity, Urine 1.004 (*)    Squamous Epithelial / LPF 0-5 (*)    All other components within normal limits  CHLAMYDIA/NGC RT PCR ([facility] ONLY)  POC URINE PREG, ED  POCT PREGNANCY, URINE    RADIOLOGY I, Alba N BROWN, personally viewed and evaluated these images (plain radiographs) as part of my medical decision making, as well as reviewing the written report by the radiologist.  [place] Transvaginal Non-ob  Result Date: 05/28/2016 CLINICAL DATA:  Ultrasound was provided for use by the ordering physician, and a technical charge was applied by the performing facility.  No radiologist interpretation/professional services rendered.   [place] Pelvis Complete  Result Date: 05/28/2016 CLINICAL DATA:  Bilateral pelvic pain for 10 days. EXAM: TRANSABDOMINAL AND TRANSVAGINAL ULTRASOUND OF PELVIS TECHNIQUE: Both transabdominal and transvaginal ultrasound examinations of the pelvis were performed. Transabdominal technique was performed for global imaging of the pelvis including uterus, ovaries, adnexal regions, and pelvic cul-de-sac. It was necessary to proceed with  endovaginal exam following the transabdominal exam to visualize the ovaries. COMPARISON:  None FINDINGS: Uterus Measurements: 6.5 x 3.8 x 5.2 cm. No fibroids or other mass visualized. Endometrium Thickness: 11 mm.  No focal abnormality visualized. Right ovary Measurements: 3.6 x 2.3 x 2.5 cm. Normal appearance/no adnexal mass. Left ovary Measurements: 3.9 x 1.9 x 2.7 cm. Normal appearance/no adnexal mass. Other findings No abnormal free fluid. IMPRESSION: Normal uterus and ovaries.  No abnormal pelvic fluid collections. Electronically Signed   By: Ellery Plunkaniel R Mitchell M.D.   On: 05/28/2016 06:09     Procedures    INITIAL IMPRESSION / ASSESSMENT AND PLAN / ED COURSE  Pertinent labs & imaging results that were available during my care of the patient were reviewed by me and considered in my medical decision making (see chart for details).  Patient's care transferred to Dr. Lenard LancePaduchowski pending vaginal swab results.   Clinical Course    ____________________________________________  FINAL CLINICAL IMPRESSION(S) / ED DIAGNOSES  Final diagnoses:  Bacterial vaginosis     MEDICATIONS GIVEN DURING THIS VISIT:  Medications -  No data to display   NEW OUTPATIENT MEDICATIONS STARTED DURING THIS VISIT:  New Prescriptions   No medications on file    Modified Medications   No medications on file    Discontinued Medications   No medications on file     Note:  This document was prepared using Dragon voice recognition software and may include unintentional dictation errors.    Darci Current, MD 05/28/16 [phone]

## 2016-05-28 NOTE — ED Notes (Signed)
Patient returns to ED 9 from ultrasound at this time. MD aware that patient is back in the department.

## 2016-05-28 NOTE — ED Notes (Signed)
Report received from Bryan RN.

## 2016-05-28 NOTE — ED Triage Notes (Signed)
Pt states that for one and a half week she has been experiencing lower abdominal pain. Pt states that the pain has escalated tonight.  Pt also reports that she has been experiencing some breakthrough bleeding. Pt is in NAD at this time in triage.

## 2016-08-29 ENCOUNTER — Encounter: Payer: Self-pay | Admitting: Emergency Medicine

## 2016-08-29 ENCOUNTER — Emergency Department
Admission: EM | Admit: 2016-08-29 | Discharge: 2016-08-29 | Disposition: A | Discharge status: Home / Self Care | Payer: Self-pay | Attending: Emergency Medicine | Admitting: Emergency Medicine

## 2016-08-29 ENCOUNTER — Emergency Department: Payer: Self-pay

## 2016-08-29 DIAGNOSIS — I10 Essential (primary) hypertension: Secondary | ICD-10-CM | POA: Insufficient documentation

## 2016-08-29 DIAGNOSIS — Z79899 Other long term (current) drug therapy: Secondary | ICD-10-CM | POA: Insufficient documentation

## 2016-08-29 DIAGNOSIS — F17210 Nicotine dependence, cigarettes, uncomplicated: Secondary | ICD-10-CM | POA: Insufficient documentation

## 2016-08-29 DIAGNOSIS — R609 Edema, unspecified: Secondary | ICD-10-CM

## 2016-08-29 DIAGNOSIS — L03115 Cellulitis of right lower limb: Secondary | ICD-10-CM | POA: Insufficient documentation

## 2016-08-29 MED ORDER — DOXYCYCLINE HYCLATE 100 MG PO TABS
100.0000 mg | ORAL_TABLET | Freq: Once | ORAL | Status: AC
  Administered 2016-08-29: 100 mg via ORAL

## 2016-08-29 MED ORDER — HYDROCHLOROTHIAZIDE 50 MG PO TABS: 50.0000 mg | ORAL_TABLET | Freq: Every day | ORAL | 0 refills | Status: DC

## 2016-08-29 MED ORDER — OXYCODONE-ACETAMINOPHEN 5-325 MG PO TABS: 1.0000 | ORAL_TABLET | Freq: Four times a day (QID) | ORAL | 0 refills | Status: DC | PRN

## 2016-08-29 MED ORDER — DOXYCYCLINE HYCLATE 100 MG PO TABS
ORAL_TABLET | ORAL | Status: AC
  Administered 2016-08-29: 100 mg via ORAL
  Filled 2016-08-29: qty 1

## 2016-08-29 MED ORDER — DOXYCYCLINE HYCLATE 100 MG PO CAPS: 100.0000 mg | ORAL_CAPSULE | Freq: Two times a day (BID) | ORAL | 0 refills | Status: DC

## 2016-08-29 NOTE — ED Triage Notes (Signed)
Pt presents with right foot and ankle swelling, pt with lymphedema hx of four years. Pt states veins in her right foot are "popping out" and that is a new symptom also states foot has been red.

## 2016-08-29 NOTE — ED Notes (Signed)
Patient transported to [facility] 

## 2016-08-29 NOTE — ED Provider Notes (Signed)
[facility] Emergency Department Provider Note  ____________________________________________   First MD Initiated Contact with Patient 08/29/16 1044     (approximate)  I have reviewed the triage vital signs and the nursing notes.   HISTORY  Chief Complaint Leg Swelling   HPI Heather Johnston is a 31 y.o. female with a history of right lower extremity lymphedema. She says that the lymphedema has been ongoing for years and as a result of recurrent cellulitis to the right lower extremity. She says that over the past week her swelling has actually gone down significantly to the right calf. However, the swelling is persistent to the right foot and it is red and she can now see veins protruding through the skin in her right foot. She says that she also has pain to the dorsum of the foot, laterally. Does not report any fever. Patient says that she usually takes 50 g of hydrochlorothiazide per day. However, she says she took her last one yesterday and needs a refill.   Past Medical History:  Diagnosis Date  . Hypertension   . Lymphedema     There are no active problems to display for this patient.   Past Surgical History:  Procedure Laterality Date  . KIDNEY SURGERY      Prior to Admission medications   Medication Sig Start Date End Date Taking? Authorizing Provider  clindamycin (CLEOCIN) 150 MG capsule Take 3 capsules (450 mg total) by mouth 3 (three) times daily. 07/21/14   Hannah Muthersbaugh, PA-C  hydrochlorothiazide (HYDRODIURIL) 50 MG tablet Take 1 tablet (50 mg total) by mouth daily. 04/17/16   Irean HongJade J Sung, MD  HYDROcodone-acetaminophen (NORCO/VICODIN) 5-325 MG per tablet Take 1 tablet by mouth every 6 (six) hours as needed for moderate pain or severe pain. 07/21/14   Hannah Muthersbaugh, PA-C  ibuprofen (ADVIL,MOTRIN) 600 MG tablet Take 1 tablet (600 mg total) by mouth every 6 (six) hours as needed. 05/28/16   Minna AntisKevin Paduchowski, MD  Multiple Vitamin  (MULTIVITAMIN WITH MINERALS) TABS tablet Take 1 tablet by mouth daily.    Historical Provider, MD  naproxen (NAPROSYN) 500 MG tablet Take 1 tablet (500 mg total) by mouth 2 (two) times daily. 04/08/14   Vanetta MuldersScott Zackowski, MD  oxyCODONE (ROXICODONE) 5 MG immediate release tablet Take 1 tablet (5 mg total) by mouth every 6 (six) hours as needed for moderate pain. Do not drive while taking this medication. 01/03/15   Gayla DossEryka A Gayle, MD  oxyCODONE-acetaminophen (ROXICET) 5-325 MG tablet Take 1 tablet by mouth every 4 (four) hours as needed for severe pain. 04/17/16   Irean HongJade J Sung, MD    Allergies Bactrim [sulfamethoxazole-trimethoprim] and Penicillins  No family history on file.  Social History Social History  Substance Use Topics  . Smoking status: Current Every Day Smoker    Packs/day: 0.50    Years: 10.00    Types: Cigarettes  . Smokeless tobacco: Never Used  . Alcohol use Yes     Comment: occ    Review of Systems Constitutional: No fever/chills Eyes: No visual changes. ENT: No sore throat. Cardiovascular: Denies chest pain. Respiratory: Denies shortness of breath. Gastrointestinal: No abdominal pain.  No nausea, no vomiting.  No diarrhea.  No constipation. Genitourinary: Negative for dysuria. Musculoskeletal: Patient with recurrent pilonidal cyst. Says that she can start to feel pain in her low back at this time which usually signifies the beginning of her pilonidal cyst/abscess. Skin: as above Neurological: Negative for headaches, focal weakness or numbness.  10-point ROS otherwise negative.  ____________________________________________   PHYSICAL EXAM:  VITAL SIGNS: ED Triage Vitals  Enc Vitals Group     BP 08/29/16 1034 (!) 157/118     Pulse Rate 08/29/16 1034 94     Resp 08/29/16 1034 18     Temp 08/29/16 1034 98.1 F (36.7 C)     Temp Source 08/29/16 1034 Oral     SpO2 08/29/16 1034 99 %     Weight 08/29/16 1037 220 lb (99.8 kg)     Height 08/29/16 1037 5\' 3"  (1.6  m)     Head Circumference --      Peak Flow --      Pain Score 08/29/16 1040 7     Pain Loc --      Pain Edu? --      Excl. in GC? --     Constitutional: Alert and oriented. Well appearing and in no acute distress. Eyes: Conjunctivae are normal. PERRL. EOMI. Head: Atraumatic. Nose: No congestion/rhinnorhea. Mouth/Throat: Mucous membranes are moist.   Neck: No stridor.   Cardiovascular: Normal rate, regular rhythm. Grossly normal heart sounds.  Good peripheral circulation With equal, intact in bilateral dorsalis pedis pulses. Respiratory: Normal respiratory effort.  No retractions. Lungs CTAB. Gastrointestinal: Soft and nontender. No distention.  Musculoskeletal: Right foot which is edematous, mildly and tenderness palpation over the lateral dorsum. Pain is not out of proportion to the exam. There is no induration or pus visualized. Cracked skin in between the toes as a possible site of entry of bacteria. Mild warmth when compared to the left. Venous complexes which are underneath the skin. They do not appear ulcerated and there is no bleeding. They do not bulge out from the skin and they're not palpable to the service of the skin. Neurologic:  Normal speech and language. No gross focal neurologic deficits are appreciated.  Skin:  As above. Also noted, there is no erythema, induration or pus drainage at the peak of the gluteal crest for the patient says that she has her pilonidal abscess. No swelling. Psychiatric: Mood and affect are normal. Speech and behavior are normal.  ____________________________________________   LABS (all labs ordered are listed, but only abnormal results are displayed)  Labs Reviewed - No data to display ____________________________________________  EKG   ____________________________________________  RADIOLOGY  US Venous Img Lower Unilateral Right (Final result)  Result time 08/29/16 12:13:32  Final result by Oley Balm, MD (08/29/16 12:13:32)             Narrative:   CLINICAL DATA: Lymphedema, swelling x5 days, foot pain, redness  EXAM: RIGHT LOWER EXTREMITY VENOUS DOPPLER ULTRASOUND  TECHNIQUE: Gray-scale sonography with compression, as well as color and duplex ultrasound, were performed to evaluate the deep venous system from the level of the common femoral vein through the popliteal and proximal calf veins.  COMPARISON: 05/22/2013  FINDINGS: Normal compressibility of the common femoral, superficial femoral, and popliteal veins, as well as the proximal calf veins. No filling defects to suggest DVT on grayscale or color Doppler imaging. Doppler waveforms show normal direction of venous flow, normal respiratory phasicity and response to augmentation. Survey views of the contralateral common femoral vein are unremarkable.  IMPRESSION: No evidence of lower extremity deep vein thrombosis, right.   Electronically Signed By: Corlis Leak M.D.          ____________________________________________   PROCEDURES  Procedure(s) performed:   Procedures  Critical Care performed:   ____________________________________________   INITIAL IMPRESSION / ASSESSMENT  AND PLAN / ED COURSE  Pertinent labs & imaging results that were available during my care of the patient were reviewed by me and considered in my medical decision making (see chart for details).  ----------------------------------------- 1:16 PM on 08/29/2016 -----------------------------------------  Patient resting over this time. No evidence of lower summary DVT. We'll treat the patient for sialitis. She says the doxycycline has worked in the past. We'll treat with Dr. Abagail Kitchens at this time. She says that antibiotic and also helped in the past for her developing pilonidal issues. Doxycycline should provide coverage for this type of bacteria as well. The patient will follow up with vascular surgery. Will be discharged home. She is understanding the plan  and willing to comply. Also asking for refill of her HCTZ.     ____________________________________________   FINAL CLINICAL IMPRESSION(S) / ED DIAGNOSES  Cellulitis. Peripheral edema.Hypertension.     NEW MEDICATIONS STARTED DURING THIS VISIT:  New Prescriptions   No medications on file     Note:  This document was prepared using Dragon voice recognition software and may include unintentional dictation errors.    Myrna Blazer, MD 08/29/16 [phone]

## 2017-04-08 IMAGING — US US EXTREM LOW VENOUS*R*
1 series · 14 of 24 positions shown · non-contrast
Comparison: 05/22/2013

CLINICAL DATA: Lymphedema, swelling x5 days, foot pain, redness

EXAM:
RIGHT LOWER EXTREMITY VENOUS DOPPLER ULTRASOUND
TECHNIQUE: Gray-scale sonography with compression, as well as color and duplex
ultrasound, were performed to evaluate the deep venous system from
the level of the common femoral vein through the popliteal and
proximal calf veins.

[Series 1: us extrem low venous*right* · 0.07mm/px · 14 of 35 slices shown]
[im 1/35]
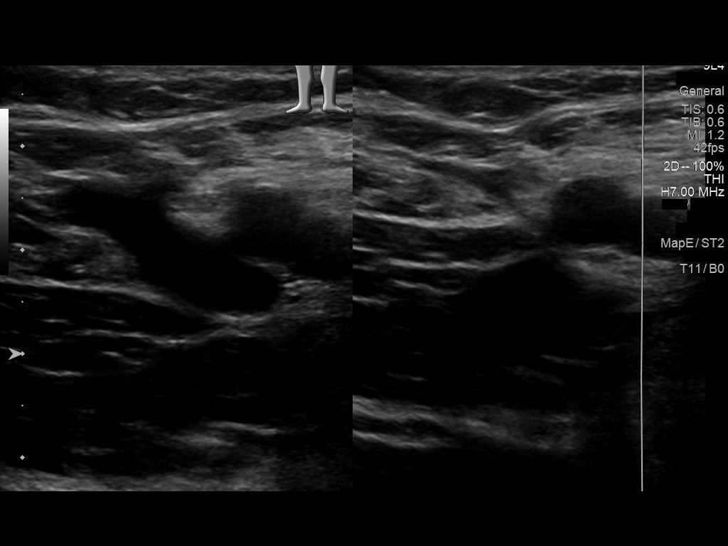
[im 3/35]
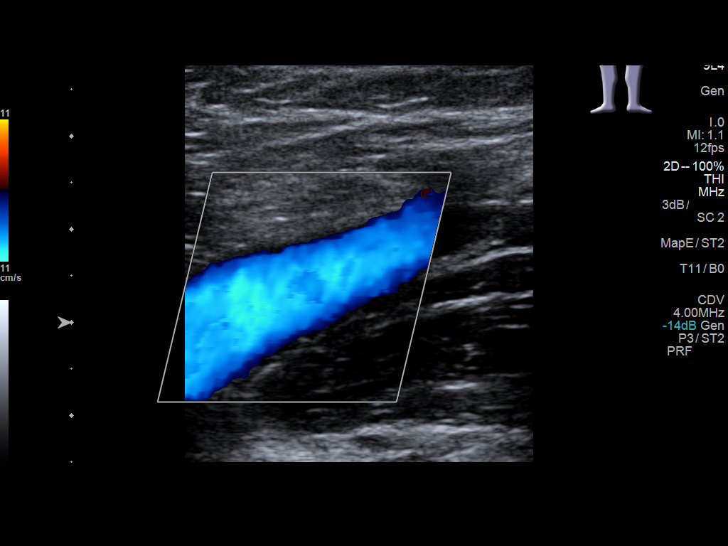
[im 6/35]
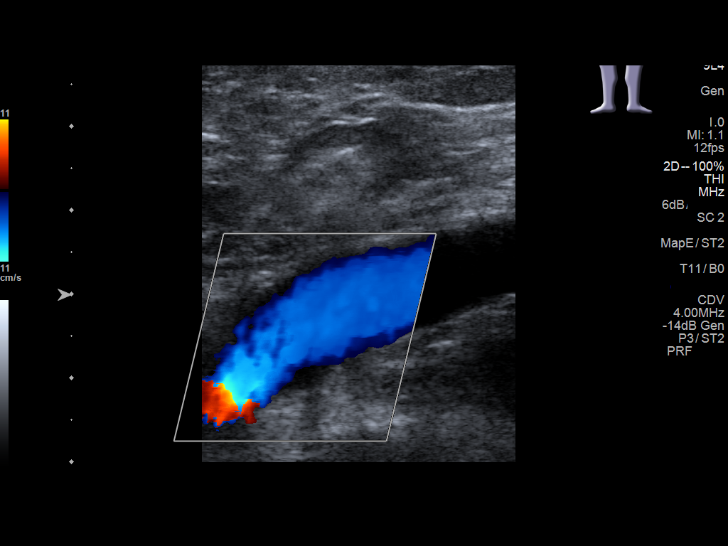
[im 9/35]
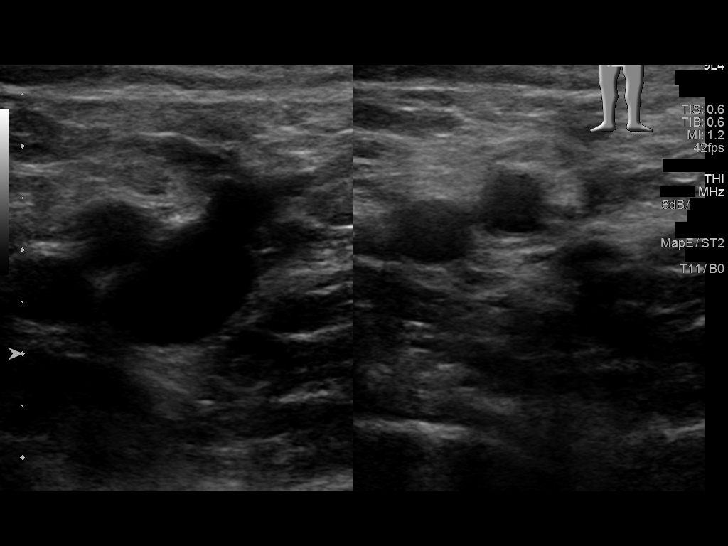
[im 11/35]
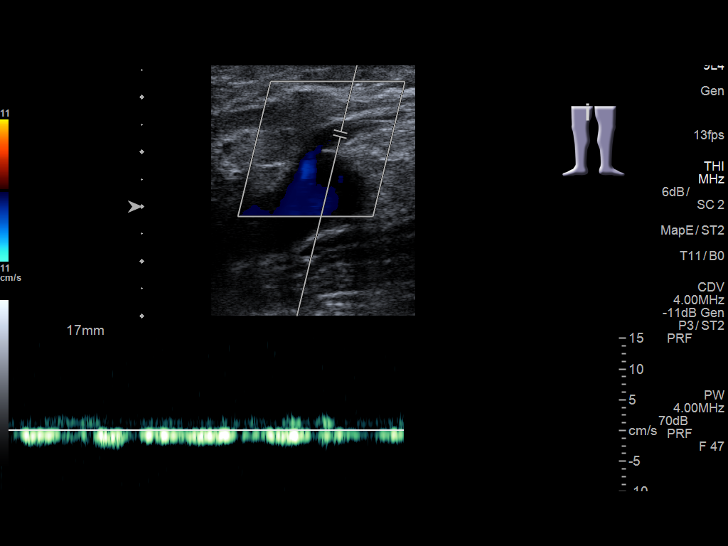
[im 14/35]
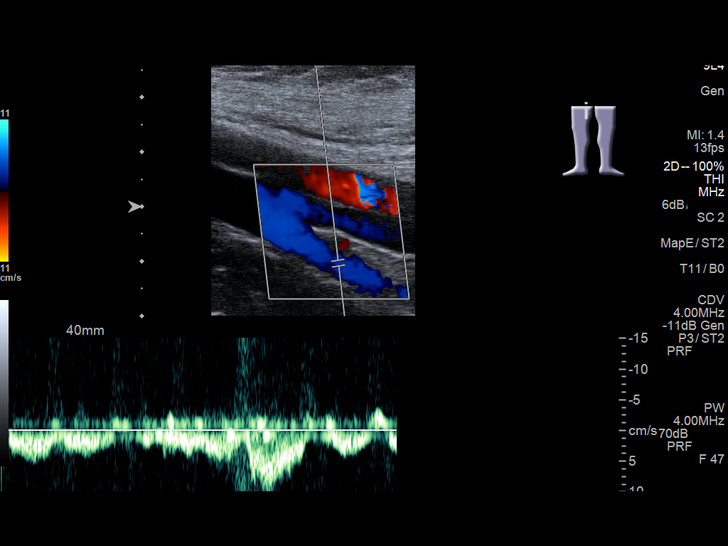
[im 17/35]
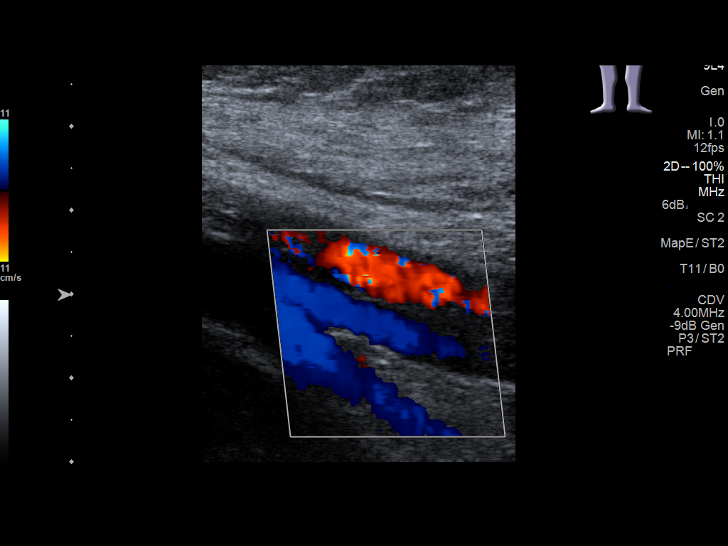
[im 18/35]
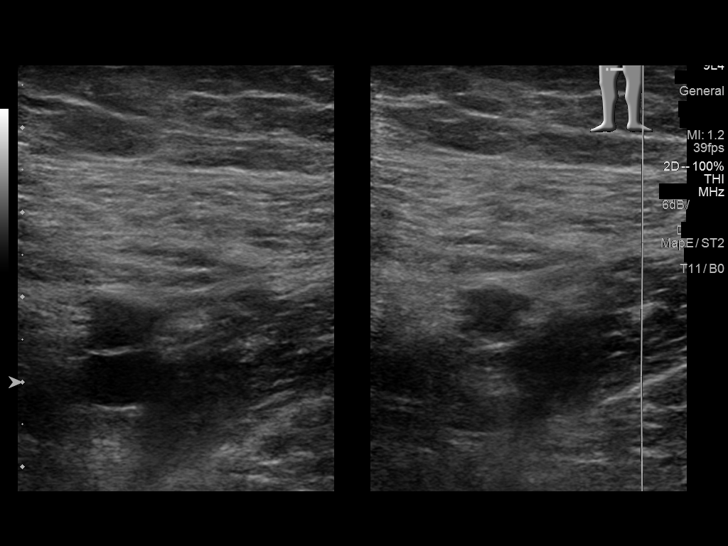
[im 21/35]
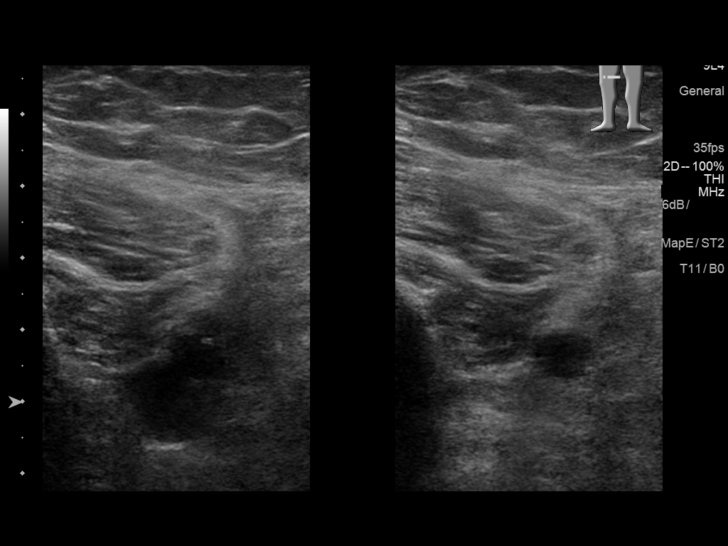
[im 24/35]
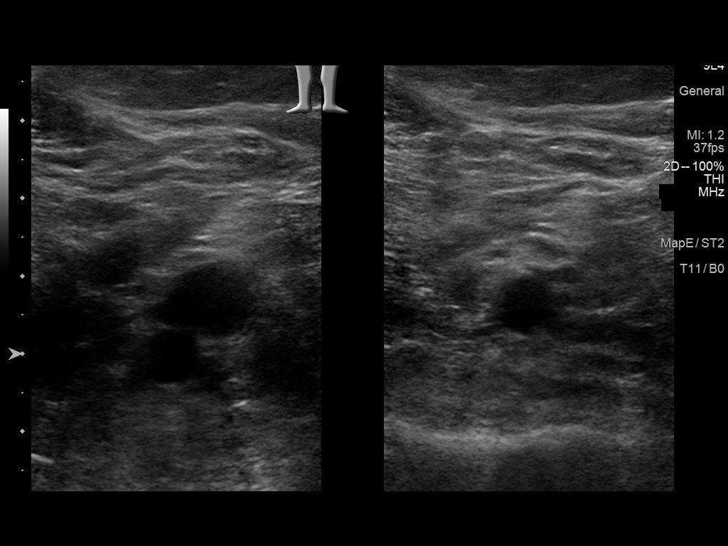
[im 27/35]
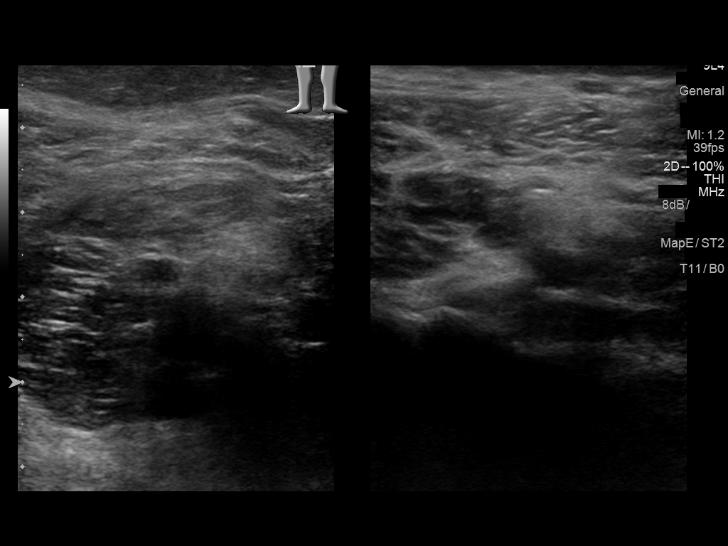
[im 29/35]
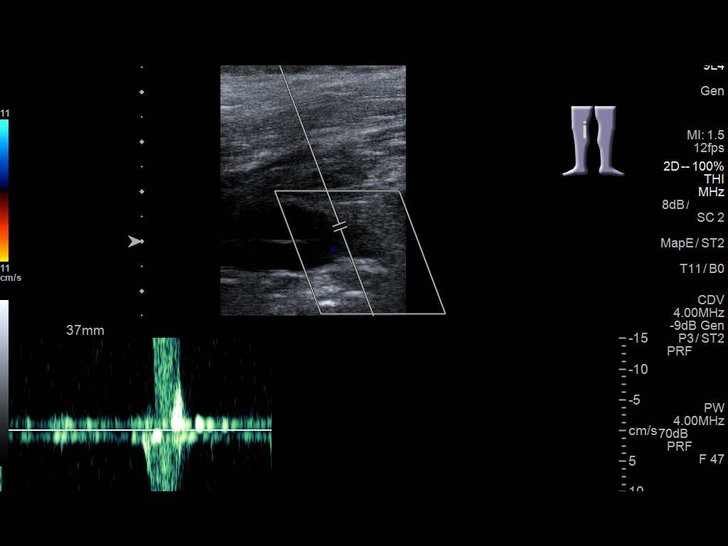
[im 32/35]
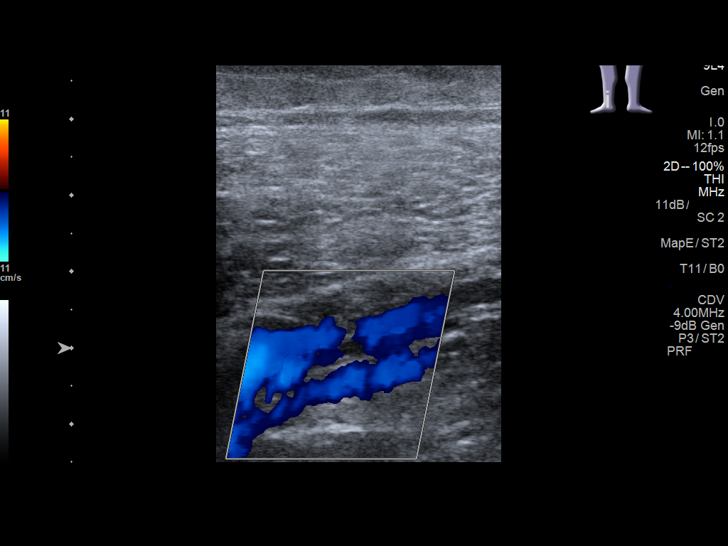
[im 35/35]
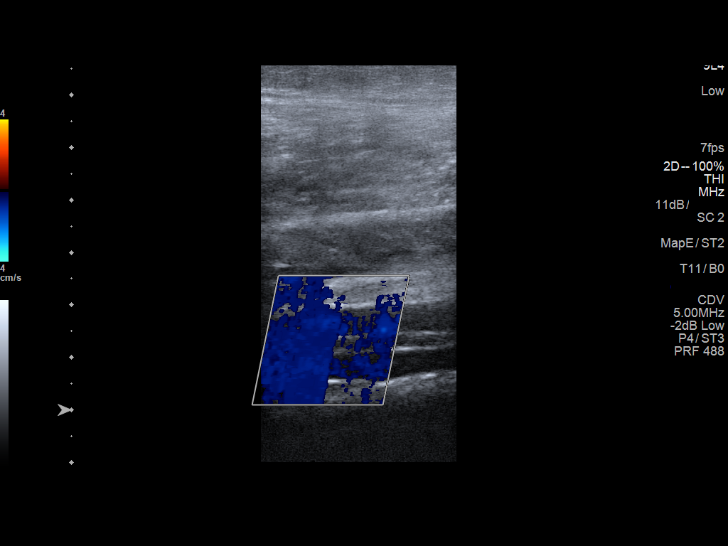

[14 of 24 positions shown; findings below may reference images not displayed]

FINDINGS: Normal compressibility of the common femoral, superficial femoral,
and popliteal veins, as well as the proximal calf veins. No filling
defects to suggest DVT on grayscale or color Doppler imaging.
Doppler waveforms show normal direction of venous flow, normal
respiratory phasicity and response to augmentation. Survey views of
the contralateral common femoral vein are unremarkable.
IMPRESSION: No evidence of  lower extremity deep vein thrombosis, right.

## 2020-04-24 ENCOUNTER — Encounter (HOSPITAL_COMMUNITY): Payer: Self-pay | Admitting: *Deleted

## 2020-04-24 ENCOUNTER — Emergency Department (HOSPITAL_COMMUNITY)
Admission: EM | Admit: 2020-04-24 | Discharge: 2020-04-24 | Disposition: A | Discharge status: Home / Self Care | Payer: Self-pay | Attending: Emergency Medicine | Admitting: Emergency Medicine

## 2020-04-24 DIAGNOSIS — I10 Essential (primary) hypertension: Secondary | ICD-10-CM | POA: Insufficient documentation

## 2020-04-24 DIAGNOSIS — G5602 Carpal tunnel syndrome, left upper limb: Secondary | ICD-10-CM

## 2020-04-24 DIAGNOSIS — F17210 Nicotine dependence, cigarettes, uncomplicated: Secondary | ICD-10-CM | POA: Insufficient documentation

## 2020-04-24 MED ORDER — PREDNISONE 10 MG (21) PO TBPK: ORAL_TABLET | Freq: Every day | ORAL | 0 refills | Status: DC

## 2020-04-24 MED ORDER — OXYCODONE-ACETAMINOPHEN 5-325 MG PO TABS
1.0000 | ORAL_TABLET | Freq: Once | ORAL | Status: AC
  Administered 2020-04-24: 1 via ORAL
  Filled 2020-04-24: qty 1

## 2020-04-24 NOTE — ED Notes (Signed)
Pt d/c home per MD order. Discharge summary reviewed with pt, pt verbalizes understanding. Reports discharge ride home. No s/s of acute distress noted.

## 2020-04-24 NOTE — ED Provider Notes (Signed)
[facility] EMERGENCY DEPARTMENT Provider Note   CSN: [id number] Arrival date & time: 04/24/20  9211     History Chief Complaint  Patient presents with  . Arm Pain    Heather Johnston is a 34 y.o. female with a past medical history of hypertension presenting to the ED with a chief complaint of left arm pain.  She was diagnosed with carpal tunnel syndrome several years ago in this extremity.  She recently moved back to [place] and when she lived in [place] was seeing an orthopedic specialist for this.  Reports paresthesias, sharp shooting pain over her wrist up her left arm.  She has been wearing a brace as she was told previously.  No prior procedures in this area.  She has been taking NSAIDs and Tylenol with only minimal improvement.  She denies any injury, trauma, numbness, weakness, shortness of breath, joint swelling.  HPI     Past Medical History:  Diagnosis Date  . Hypertension   . Lymphedema     There are no problems to display for this patient.   Past Surgical History:  Procedure Laterality Date  . KIDNEY SURGERY       OB History   No obstetric history on file.     No family history on file.  Social History   Tobacco Use  . Smoking status: Current Every Day Smoker    Packs/day: 0.50    Years: 10.00    Pack years: 5.00    Types: Cigarettes  . Smokeless tobacco: Never Used  Substance Use Topics  . Alcohol use: Yes    Comment: occ  . Drug use: No    Home Medications Prior to Admission medications   Medication Sig Start Date End Date Taking? Authorizing Provider  clindamycin (CLEOCIN) 150 MG capsule Take 3 capsules (450 mg total) by mouth 3 (three) times daily. 07/21/14   Muthersbaugh, Dahlia Client, PA-C  doxycycline (VIBRAMYCIN) 100 MG capsule Take 1 capsule (100 mg total) by mouth 2 (two) times daily. 08/29/16   Myrna Blazer, MD  hydrochlorothiazide (HYDRODIURIL) 50 MG tablet Take 1 tablet (50 mg total) by mouth daily. 08/29/16    Myrna Blazer, MD  HYDROcodone-acetaminophen (NORCO/VICODIN) 5-325 MG per tablet Take 1 tablet by mouth every 6 (six) hours as needed for moderate pain or severe pain. 07/21/14   Muthersbaugh, Dahlia Client, PA-C  ibuprofen (ADVIL,MOTRIN) 600 MG tablet Take 1 tablet (600 mg total) by mouth every 6 (six) hours as needed. 05/28/16   Minna Antis, MD  Multiple Vitamin (MULTIVITAMIN WITH MINERALS) TABS tablet Take 1 tablet by mouth daily.    [provider]  naproxen (NAPROSYN) 500 MG tablet Take 1 tablet (500 mg total) by mouth 2 (two) times daily. 04/08/14   Vanetta Mulders, MD  oxyCODONE (ROXICODONE) 5 MG immediate release tablet Take 1 tablet (5 mg total) by mouth every 6 (six) hours as needed for moderate pain. Do not drive while taking this medication. 01/03/15   Gayla Doss, MD  oxyCODONE-acetaminophen (ROXICET) 5-325 MG tablet Take 1-2 tablets by mouth every 6 (six) hours as needed. 08/29/16   Myrna Blazer, MD  predniSONE (STERAPRED UNI-PAK 21 TAB) 10 MG (21) TBPK tablet Take by mouth daily. Take 6 tabs by mouth daily  for 2 days, then 5 tabs for 2 days, then 4 tabs for 2 days, then 3 tabs for 2 days, 2 tabs for 2 days, then 1 tab by mouth daily for 2 days 04/24/20  Vanice Rappa, PA-C    Allergies    Bactrim [sulfamethoxazole-trimethoprim], Penicillins, and Toradol [ketorolac tromethamine]  Review of Systems   Review of Systems  Constitutional: Negative for chills and fever.  Musculoskeletal: Positive for arthralgias.  Neurological: Negative for weakness and numbness.    Physical Exam Updated Vital Signs BP (!) 178/126 (BP Location: Right Arm)   Pulse (!) 110   Temp 98.2 F (36.8 C) (Oral)   Resp 18   SpO2 100%   Physical Exam Vitals and nursing note reviewed.  Constitutional:      General: She is not in acute distress.    Appearance: She is well-developed. She is not diaphoretic.  HENT:     Head: Normocephalic and atraumatic.  Eyes:      General: No scleral icterus.    Conjunctiva/sclera: Conjunctivae normal.  Pulmonary:     Effort: Pulmonary effort is normal. No respiratory distress.  Musculoskeletal:        General: Tenderness present.     Cervical back: Normal range of motion.     Comments: Positive Tinel's test.  Normal sensation to light touch bilateral upper extremities.  2+ radial pulses noted bilaterally.  Strength 5/5 in bilateral upper extremities.  No deformities, erythema, edema or warmth of joint.  Skin:    Findings: No rash.  Neurological:     Mental Status: She is alert.     ED Results / Procedures / Treatments   Labs (all labs ordered are listed, but only abnormal results are displayed) Labs Reviewed - No data to display  EKG None  Radiology No results found.  Procedures Procedures (including critical care time)  Medications Ordered in ED Medications  oxyCODONE-acetaminophen (PERCOCET/ROXICET) 5-325 MG per tablet 1 tablet (has no administration in time range)    ED Course  I have reviewed the triage vital signs and the nursing notes.  Pertinent labs & imaging results that were available during my care of the patient were reviewed by me and considered in my medical decision making (see chart for details).    MDM Rules/Calculators/A&P                          34 year old female with a history of carpal tunnel syndrome presenting to the ED with left wrist pain.  Reports pain has worsened is now it is shooting up her left arm.  This is her nondominant hand.  Minimal improvement noted with NSAIDs.  She continues to wear her brace.  No denies any injuries, changes to sensation or strength.  Areas neurovascularly intact.  Able to perform range of motion although does report pain with flexion.  Suspect that her symptoms are due to radiculopathy.  Will treat with steroids have her establish care with an orthopedic specialist here.  Doubt infectious or vascular cause of symptoms.  Return precautions  given. She denies possibility of pregnancy.  Patient is hemodynamically stable, in NAD, and able to ambulate in the ED. Evaluation does not show pathology that would require ongoing emergent intervention or inpatient treatment. I explained the diagnosis to the patient. Pain has been managed and has no complaints prior to discharge. Patient is comfortable with above plan and is stable for discharge at this time. All questions were answered prior to disposition. Strict return precautions for returning to the ED were discussed. Encouraged follow up with PCP.   An After Visit Summary was printed and given to the patient.   Portions of this note were  generated with Scientist, clinical (histocompatibility and immunogenetics). Dictation errors may occur despite best attempts at proofreading.  Final Clinical Impression(s) / ED Diagnoses Final diagnoses:  Carpal tunnel syndrome of left wrist    Rx / DC Orders ED Discharge Orders         Ordered    predniSONE (STERAPRED UNI-PAK 21 TAB) 10 MG (21) TBPK tablet  Daily        04/24/20 0907           Dietrich Pates, PA-C 04/24/20 9147    Sabas Sous, MD 04/24/20 [phone]

## 2020-04-24 NOTE — Discharge Instructions (Signed)
Take the steroids to help with inflammation. Follow-up with the orthopedic specialist listed below. Return to the ER if you start to experience worsening pain, injuries or falls, redness, warmth of your joint.

## 2020-04-24 NOTE — ED Triage Notes (Signed)
To ED for eval of left arm/wrist pain and tingling. States she was dx with carpal tunnel. Pain is better with wrist splint she's wearing. Pain is now radiating up to elbow. No cp. No sob.

## 2020-05-30 ENCOUNTER — Other Ambulatory Visit: Payer: Self-pay

## 2020-05-30 ENCOUNTER — Emergency Department: Payer: Medicaid Other

## 2020-05-30 ENCOUNTER — Emergency Department
Admission: EM | Admit: 2020-05-30 | Discharge: 2020-05-30 | Disposition: A | Discharge status: Home / Self Care | Payer: Medicaid Other | Attending: Emergency Medicine | Admitting: Emergency Medicine

## 2020-05-30 ENCOUNTER — Encounter: Payer: Self-pay | Admitting: Emergency Medicine

## 2020-05-30 DIAGNOSIS — Z20822 Contact with and (suspected) exposure to covid-19: Secondary | ICD-10-CM | POA: Insufficient documentation

## 2020-05-30 DIAGNOSIS — E860 Dehydration: Secondary | ICD-10-CM | POA: Insufficient documentation

## 2020-05-30 DIAGNOSIS — R042 Hemoptysis: Secondary | ICD-10-CM

## 2020-05-30 DIAGNOSIS — Z882 Allergy status to sulfonamides status: Secondary | ICD-10-CM | POA: Insufficient documentation

## 2020-05-30 DIAGNOSIS — F17210 Nicotine dependence, cigarettes, uncomplicated: Secondary | ICD-10-CM | POA: Insufficient documentation

## 2020-05-30 DIAGNOSIS — L0501 Pilonidal cyst with abscess: Secondary | ICD-10-CM | POA: Insufficient documentation

## 2020-05-30 DIAGNOSIS — Z79899 Other long term (current) drug therapy: Secondary | ICD-10-CM | POA: Insufficient documentation

## 2020-05-30 DIAGNOSIS — Z880 Allergy status to penicillin: Secondary | ICD-10-CM | POA: Insufficient documentation

## 2020-05-30 DIAGNOSIS — I10 Essential (primary) hypertension: Secondary | ICD-10-CM

## 2020-05-30 LAB — CBC
HCT: 34.4 % — ABNORMAL LOW (ref 36.0–46.0)
Hemoglobin: 11.4 g/dL — ABNORMAL LOW (ref 12.0–15.0)
MCH: 28.8 pg (ref 26.0–34.0)
MCHC: 33.1 g/dL (ref 30.0–36.0)
MCV: 86.9 fL (ref 80.0–100.0)
Platelets: 295 10*3/uL (ref 150–400)
RBC: 3.96 MIL/uL (ref 3.87–5.11)
RDW: 14.1 % (ref 11.5–15.5)
WBC: 9.5 10*3/uL (ref 4.0–10.5)
nRBC: 0 % (ref 0.0–0.2)

## 2020-05-30 LAB — TROPONIN I (HIGH SENSITIVITY)
Troponin I (High Sensitivity): 13 ng/L (ref <18)
Troponin I (High Sensitivity): 12 ng/L (ref <18)

## 2020-05-30 LAB — TYPE AND SCREEN
ABO/RH(D): A POS
Antibody Screen: NEGATIVE

## 2020-05-30 LAB — COMPREHENSIVE METABOLIC PANEL
ALT: 22 U/L (ref 0–44)
AST: 21 U/L (ref 15–41)
Albumin: 4 g/dL (ref 3.5–5.0)
Alkaline Phosphatase: 50 U/L (ref 38–126)
Anion gap: 10 (ref 5–15)
BUN: 15 mg/dL (ref 6–20)
CO2: 25 mmol/L (ref 22–32)
Calcium: 9 mg/dL (ref 8.9–10.3)
Chloride: 103 mmol/L (ref 98–111)
Creatinine, Ser: 1.52 mg/dL — ABNORMAL HIGH (ref 0.44–1.00)
GFR, Estimated: 46 mL/min — ABNORMAL LOW (ref >60)
Glucose, Bld: 114 mg/dL — ABNORMAL HIGH (ref 70–99)
Potassium: 3.5 mmol/L (ref 3.5–5.1)
Sodium: 138 mmol/L (ref 135–145)
Total Bilirubin: 0.6 mg/dL (ref 0.3–1.2)
Total Protein: 7.4 g/dL (ref 6.5–8.1)

## 2020-05-30 LAB — RESPIRATORY PANEL BY RT PCR (FLU A&B, COVID)
Influenza A by PCR: NEGATIVE
Influenza B by PCR: NEGATIVE
SARS Coronavirus 2 by RT PCR: NEGATIVE

## 2020-05-30 LAB — POC URINE PREG, ED: Preg Test, Ur: NEGATIVE

## 2020-05-30 LAB — FIBRIN DERIVATIVES D-DIMER (ARMC ONLY): Fibrin derivatives D-dimer (ARMC): 397.61 ng/mL (FEU) (ref 0.00–499.00)

## 2020-05-30 MED ORDER — HYDROCHLOROTHIAZIDE 25 MG PO TABS
50.0000 mg | ORAL_TABLET | Freq: Every day | ORAL | Status: DC
  Administered 2020-05-30: 50 mg via ORAL
  Filled 2020-05-30: qty 2

## 2020-05-30 MED ORDER — ACETAMINOPHEN 500 MG PO TABS
1000.0000 mg | ORAL_TABLET | Freq: Once | ORAL | Status: AC
  Administered 2020-05-30: 1000 mg via ORAL
  Filled 2020-05-30: qty 2

## 2020-05-30 MED ORDER — DOXYCYCLINE HYCLATE 100 MG PO CAPS: 100.0000 mg | ORAL_CAPSULE | Freq: Two times a day (BID) | ORAL | 0 refills | Status: AC

## 2020-05-30 MED ORDER — LACTATED RINGERS IV BOLUS
1000.0000 mL | Freq: Once | INTRAVENOUS | Status: AC
  Administered 2020-05-30: 1000 mL via INTRAVENOUS

## 2020-05-30 MED ORDER — DOXYCYCLINE HYCLATE 100 MG PO CAPS: 100.0000 mg | ORAL_CAPSULE | Freq: Two times a day (BID) | ORAL | 0 refills | Status: DC

## 2020-05-30 NOTE — ED Provider Notes (Signed)
[facility] Emergency Department Provider Note  ____________________________________________   First MD Initiated Contact with Patient 05/30/20 [phone]     (approximate)  I have reviewed the triage vital signs and the nursing notes.   HISTORY  Chief Complaint Hemoptysis   HPI Heather Johnston is a 34 y.o. female with past medical history of HTN, right lower extremity lymphedema secondary to multiple I&D's from cellulitis and recurrent pilonidal abscesses who presents for assessment of coughing that began yesterday and became associate with some blood this morning around 3 AM.  Patient states she has coughed up about a tablespoon of blood.  No prior similar episodes.  She states she feels little short of breath when she is coughing but not otherwise at rest.  He denies any chest pain.  She also notes her last couple days she has had recurrence of pilonidal cyst.  He states he would like some antibiotics for this and does not want to be "lanced".  She denies any headache, earache, sore throat, fevers, chills, abdominal pain, nausea, vomiting, diarrhea, dysuria, rash, or other areas of redness pain or swelling other than his of her left superior gluteal fold.  She is not anticoagulated.  She endorses tobacco abuse and intermittent EtOH use.  Denies illegal drug use.         Past Medical History:  Diagnosis Date   Hypertension    Lymphedema     There are no problems to display for this patient.   Past Surgical History:  Procedure Laterality Date   KIDNEY SURGERY      Prior to Admission medications   Medication Sig Start Date End Date Taking? Authorizing Provider  doxycycline (VIBRAMYCIN) 100 MG capsule Take 1 capsule (100 mg total) by mouth 2 (two) times daily for 10 days. 05/30/20 06/09/20  Gilles Chiquito, MD  hydrochlorothiazide (HYDRODIURIL) 50 MG tablet Take 1 tablet (50 mg total) by mouth daily. 08/29/16   Myrna Blazer, MD  Multiple  Vitamin (MULTIVITAMIN WITH MINERALS) TABS tablet Take 1 tablet by mouth daily.    [provider]    Allergies Bactrim [sulfamethoxazole-trimethoprim], Penicillins, and Toradol [ketorolac tromethamine]  No family history on file.  Social History Social History   Tobacco Use   Smoking status: Current Every Day Smoker    Packs/day: 0.50    Years: 10.00    Pack years: 5.00    Types: Cigarettes   Smokeless tobacco: Never Used  Substance Use Topics   Alcohol use: Yes    Comment: occ   Drug use: No    Review of Systems  Review of Systems  Constitutional: Negative for chills and fever.  HENT: Negative for sore throat.   Eyes: Negative for pain.  Respiratory: Positive for cough, hemoptysis and shortness of breath. Negative for stridor.   Cardiovascular: Negative for chest pain.  Gastrointestinal: Negative for vomiting.  Genitourinary: Negative for dysuria.  Musculoskeletal: Negative for joint pain and neck pain.  Skin: Negative for rash.  Neurological: Negative for seizures, loss of consciousness and headaches.  Psychiatric/Behavioral: Negative for suicidal ideas.  All other systems reviewed and are negative.     ____________________________________________   PHYSICAL EXAM:  VITAL SIGNS: ED Triage Vitals  Enc Vitals Group     BP 05/30/20 0327 (!) 190/130     Pulse Rate 05/30/20 0327 (!) 108     Resp 05/30/20 0327 18     Temp 05/30/20 0327 99.5 F (37.5 C)     Temp Source  05/30/20 0327 Oral     SpO2 05/30/20 0327 99 %     Weight 05/30/20 0332 257 lb 15 oz (117 kg)     Height 05/30/20 0332 5\' 3"  (1.6 m)     Head Circumference --      Peak Flow --      Pain Score 05/30/20 0331 8     Pain Loc --      Pain Edu? --      Excl. in GC? --    Vitals:   05/30/20 0327 05/30/20 0331  BP: (!) 190/130 (!) 186/114  Pulse: (!) 108 (!) 106  Resp: 18 18  Temp: 99.5 F (37.5 C) 99.5 F (37.5 C)  SpO2: 99% 98%   Physical Exam Vitals and nursing note  reviewed.  Constitutional:      General: She is not in acute distress.    Appearance: She is well-developed. She is obese.  HENT:     Head: Normocephalic and atraumatic.     Right Ear: External ear normal.     Left Ear: External ear normal.     Nose: Nose normal.  Eyes:     Conjunctiva/sclera: Conjunctivae normal.  Cardiovascular:     Rate and Rhythm: Normal rate and regular rhythm.     Pulses: Normal pulses.     Heart sounds: No murmur heard.   Pulmonary:     Effort: Pulmonary effort is normal. No respiratory distress.     Breath sounds: Normal breath sounds.  Abdominal:     Palpations: Abdomen is soft.     Tenderness: There is no abdominal tenderness.  Musculoskeletal:     Cervical back: Neck supple.     Right lower leg: Edema present.  Skin:    General: Skin is warm and dry.     Capillary Refill: Capillary refill takes less than 2 seconds.  Neurological:     Mental Status: She is alert and oriented to person, place, and time.  Psychiatric:        Mood and Affect: Mood normal.     Patient does have an area of approximately 1 cm circular fluctuance over her left superior gluteal cleft that is tender with some mild surrounding induration. ____________________________________________   LABS (all labs ordered are listed, but only abnormal results are displayed)  Labs Reviewed  COMPREHENSIVE METABOLIC PANEL - Abnormal; Notable for the following components:      Result Value   Glucose, Bld 114 (*)    Creatinine, Ser 1.52 (*)    GFR, Estimated 46 (*)    All other components within normal limits  CBC - Abnormal; Notable for the following components:   Hemoglobin 11.4 (*)    HCT 34.4 (*)    All other components within normal limits  RESPIRATORY PANEL BY RT PCR (FLU A&B, COVID)  FIBRIN DERIVATIVES D-DIMER ([facility] ONLY)  POC URINE PREG, ED  TYPE AND SCREEN  TROPONIN I (HIGH SENSITIVITY)  TROPONIN I (HIGH SENSITIVITY)  TROPONIN I (HIGH SENSITIVITY)    ____________________________________________  EKG  Sinus rhythm with a ventricular rate of 93, normal axis, unremarkable intervals, T wave inversions in the lateral leads V4, V5 and V6 that appear new when compared to prior.  No other evidence of acute ischemia or other significant underlying arrhythmia. ____________________________________________  RADIOLOGY  ED MD interpretation: No focal consolidations or effusions, pneumothorax, or other clear acute intrathoracic abnormality.  Official radiology report(s): DG Chest 2 View  Result Date: 05/30/2020 CLINICAL DATA:  Initial evaluation for  acute hemoptysis. EXAM: CHEST - 2 VIEW COMPARISON:  Prior radiograph from 02/16/2013. FINDINGS: The cardiac and mediastinal silhouettes are stable in size and contour, and remain within normal limits. The lungs are normally inflated. No airspace consolidation, pleural effusion, or pulmonary edema. No pneumothorax. No acute osseous abnormality. IMPRESSION: No radiographic evidence for active cardiopulmonary disease. Electronically Signed   By: Rise Mu M.D.   On: 05/30/2020 04:36    ____________________________________________   PROCEDURES  Procedure(s) performed (including Critical Care):  Procedures   ____________________________________________   INITIAL IMPRESSION / ASSESSMENT AND PLAN / ED COURSE        Patient presents with [place] to history exam for assessment of coughing that began yesterday but became bloody this morning.  On arrival patient is tachycardic with a heart rate of 106, hypertensive with a BP of 186/114, with otherwise stable vital signs on room air.  Primary differential includes but is not limited to PE, bronchitis, pneumonia, diffuse alveolar hemorrhage, and malignancy.  Patient did have triage ECG ordered which is of some nonspecific with possible ischemic changes in the lateral leads.  However given she denies any chest pain on my assessment and I do not  elevate troponin is low suspicion for ACS at this time.  Low suspicion for PE as patient's D-dimer is less than 500.  Chest x-ray shows no evidence of pneumonia or evidence of malignancy.  CMP remarkable for creatinine of 1.52 which is compared to 1.384 years ago.  Is consistent with some mild dehydration.  No other significant electrolyte or metabolic derangements.  CBC is unremarkable aside from a hemoglobin of 11.4 compared to 13.334 years ago.  Low suspicion for acute symptomatic anemia.  Covid is negative.  Impression is likely some hemoptysis secondary to bronchitis.  Given stable vital signs and improvement blood pressure after patient's home hydrochlorothiazide was given with otherwise reassuring exam and work-up I believe she is safe for discharge with plan for outpatient follow-up regarding this.  With regard to patient's area of tenderness redness and swelling in her gluteal cleft this is most consistent with recurrent pilonidal abscess.  Advised patient that the recommended treatment for this was incision and drainage followed by antibiotics.  Patient adamantly refused any I&D but was amenable to antibiotics.  Patient discharged stable condition per strict return precautions advised and discussed.  ____________________________________________   FINAL CLINICAL IMPRESSION(S) / ED DIAGNOSES  Final diagnoses:  Hemoptysis  Pilonidal abscess  Hypertension, unspecified type  Dehydration    Medications  hydrochlorothiazide (HYDRODIURIL) tablet 50 mg (50 mg Oral Given 05/30/20 0843)  lactated ringers bolus 1,000 mL (1,000 mLs Intravenous Bolus 05/30/20 0844)  acetaminophen (TYLENOL) tablet 1,000 mg (1,000 mg Oral Given 05/30/20 0843)     ED Discharge Orders         Ordered    doxycycline (VIBRAMYCIN) 100 MG capsule  2 times daily        05/30/20 7371           Note:  This document was prepared using Dragon voice recognition software and may include unintentional dictation  errors.   Gilles Chiquito, MD 05/30/20 1038

## 2020-05-30 NOTE — ED Notes (Addendum)
First encounter with pt, this RN was not notified of pt being in room. Pt states she is here due to vomiting blood. Pt presents with a water bottle with about 30 ml of bright red flank blood. Pt presents with c/o of tailbone pain that is chronic due to cyst. Pt states she has been coughing excessively. Pt denies N/V/D. Pt is A&Ox4. Pt sates she has not been sick otherwise. Pt is A&Ox4.

## 2020-05-30 NOTE — ED Triage Notes (Addendum)
Patient states that she started coughing up bright red blood at 03:00 this am. Patient with complaint of some rib pain with coughing.  Patient also states that she has a pilonidal cyst that is inflamed.

## 2020-07-22 ENCOUNTER — Encounter: Payer: Self-pay | Admitting: Emergency Medicine

## 2020-07-22 ENCOUNTER — Other Ambulatory Visit: Payer: Self-pay

## 2020-07-22 ENCOUNTER — Emergency Department
Admission: EM | Admit: 2020-07-22 | Discharge: 2020-07-22 | Disposition: A | Discharge status: Home / Self Care | Payer: Medicaid Other | Attending: Emergency Medicine | Admitting: Emergency Medicine

## 2020-07-22 DIAGNOSIS — L0591 Pilonidal cyst without abscess: Secondary | ICD-10-CM | POA: Insufficient documentation

## 2020-07-22 DIAGNOSIS — I10 Essential (primary) hypertension: Secondary | ICD-10-CM | POA: Insufficient documentation

## 2020-07-22 DIAGNOSIS — F17210 Nicotine dependence, cigarettes, uncomplicated: Secondary | ICD-10-CM | POA: Insufficient documentation

## 2020-07-22 MED ORDER — DOXYCYCLINE HYCLATE 100 MG PO TABS
100.0000 mg | ORAL_TABLET | Freq: Once | ORAL | Status: AC
  Administered 2020-07-22: 22:00:00 100 mg via ORAL
  Filled 2020-07-22: qty 1

## 2020-07-22 MED ORDER — HYDROCODONE-ACETAMINOPHEN 5-325 MG PO TABS
1.0000 | ORAL_TABLET | Freq: Once | ORAL | Status: AC
  Administered 2020-07-22: 22:00:00 1 via ORAL
  Filled 2020-07-22: qty 1

## 2020-07-22 MED ORDER — ONDANSETRON 4 MG PO TBDP
4.0000 mg | ORAL_TABLET | Freq: Once | ORAL | Status: AC | PRN
  Administered 2020-07-22: 19:00:00 4 mg via ORAL
  Filled 2020-07-22: qty 1

## 2020-07-22 MED ORDER — DOXYCYCLINE HYCLATE 100 MG PO TABS: 100.0000 mg | ORAL_TABLET | Freq: Two times a day (BID) | ORAL | 0 refills | Status: DC

## 2020-07-22 MED ORDER — HYDROCODONE-ACETAMINOPHEN 5-325 MG PO TABS
1.0000 | ORAL_TABLET | ORAL | 0 refills | Status: DC | PRN
Start: 2020-07-22 — End: 2020-12-11

## 2020-07-22 NOTE — ED Provider Notes (Signed)
[facility] Emergency Department Provider Note  ____________________________________________  Time seen: Approximately 8:53 PM  I have reviewed the triage vital signs and the nursing notes.   HISTORY  Chief Complaint Cyst    HPI Heather Johnston is a 34 y.o. female who presents the emergency department complaining of recurrent pilonidal cyst.  Patient states that she has been dealing with this issue for the past 6 to 7 years.  She states that she used to live in [place], had seen a general surgery group there that did not want to perform surgery for her pilonidal cyst.  She states that she is having a recurrence with pain, swelling, drainage.  No systemic complaints of fevers or chills, nausea vomiting or abdominal pain.  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  She states that she has a recurrence every 1 to 2 months.  Patient is agreeable to seeing general surgery here after completing a course of antibiotics.         Past Medical History:  Diagnosis Date  . Hypertension   . Lymphedema     There are no problems to display for this patient.   Past Surgical History:  Procedure Laterality Date  . KIDNEY SURGERY      Prior to Admission medications   Medication Sig Start Date End Date Taking? Authorizing Provider  doxycycline (VIBRA-TABS) 100 MG tablet Take 1 tablet (100 mg total) by mouth 2 (two) times daily. 07/22/20  Yes Jerrico Covello, Delorise Royals, PA-C  HYDROcodone-acetaminophen (NORCO/VICODIN) 5-325 MG tablet Take 1 tablet by mouth every 4 (four) hours as needed for moderate pain. 07/22/20  Yes Xayne Brumbaugh, Delorise Royals, PA-C  hydrochlorothiazide (HYDRODIURIL) 50 MG tablet Take 1 tablet (50 mg total) by mouth daily. 08/29/16   Myrna Blazer, MD  Multiple Vitamin (MULTIVITAMIN WITH MINERALS) TABS tablet Take 1 tablet by mouth daily.    [provider]    Allergies Bactrim [sulfamethoxazole-trimethoprim], Penicillins, and Toradol [ketorolac tromethamine]  History reviewed. No pertinent family history.  Social History Social History   Tobacco Use  . Smoking status: Current Every Day Smoker    Packs/day: 0.50    Years: 10.00    Pack years: 5.00    Types: Cigarettes  . Smokeless tobacco: Never Used  Substance Use Topics  . Alcohol use: Yes    Comment: occ  . Drug use: No     Review of Systems  Constitutional: No fever/chills Eyes: No visual changes. No discharge ENT: No upper respiratory complaints. Cardiovascular: no chest pain. Respiratory: no cough. No SOB. Gastrointestinal: No abdominal pain.  No nausea, no vomiting.  No diarrhea.  No constipation. Musculoskeletal: Negative for musculoskeletal pain. Skin: Positive for recurrent pilonidal cyst . Neurological: Negative for headaches, focal weakness or numbness.  10 System ROS otherwise negative.  ____________________________________________   PHYSICAL EXAM:  VITAL SIGNS: ED Triage Vitals  Enc Vitals Group     BP 07/22/20 1913 (!) 187/117     Pulse Rate 07/22/20 1913 (!) 104     Resp 07/22/20 1913 18     Temp 07/22/20 1913 99.9 F (37.7 C)     Temp Source 07/22/20 1913 Oral     SpO2 07/22/20 1913 99 %     Weight 07/22/20 1913 265 lb (120.2 kg)     Height 07/22/20 1913 5\' 3"  (1.6 m)     Head Circumference --  Peak Flow --      Pain Score 07/22/20 1919 10     Pain Loc --      Pain Edu? --      Excl. in GC? --      Constitutional: Alert and oriented. Well appearing and in no acute distress. Eyes: Conjunctivae are normal. PERRL. EOMI. Head: Atraumatic. ENT:      Ears:       Nose: No congestion/rhinnorhea.      Mouth/Throat: Mucous membranes are moist.  Neck: No stridor.    Cardiovascular: Normal rate, regular rhythm. Normal S1 and S2.  Good peripheral circulation. Respiratory: Normal respiratory effort without tachypnea or  retractions. Lungs CTAB. Good air entry to the bases with no decreased or absent breath sounds. Gastrointestinal: Bowel sounds 4 quadrants. Soft and nontender to palpation. No guarding or rigidity. No palpable masses. No distention. No CVA tenderness. Musculoskeletal: Full range of motion to all extremities. No gross deformities appreciated. Neurologic:  Normal speech and language. No gross focal neurologic deficits are appreciated.  Skin:  Skin is warm, dry and intact. No rash noted.  Visualization of the superior aspect of the intergluteal cleft revealed erythematous lesion with what appears to be a fistula consistent with recurrent pilonidal cyst disease.  Area is tender to palpation.  Palpation does not express any purulent drainage currently.  No fluctuance.  There is no extension of erythema into the buttocks itself Psychiatric: Mood and affect are normal. Speech and behavior are normal. Patient exhibits appropriate insight and judgement.   ____________________________________________   LABS (all labs ordered are listed, but only abnormal results are displayed)  Labs Reviewed - No data to display ____________________________________________  EKG   ____________________________________________  RADIOLOGY   No results found.  ____________________________________________    PROCEDURES  Procedure(s) performed:    Procedures    Medications  doxycycline (VIBRA-TABS) tablet 100 mg (has no administration in time range)  HYDROcodone-acetaminophen (NORCO/VICODIN) 5-325 MG per tablet 1 tablet (has no administration in time range)  ondansetron (ZOFRAN-ODT) disintegrating tablet 4 mg (4 mg Oral Given 07/22/20 1927)     ____________________________________________   INITIAL IMPRESSION / ASSESSMENT AND PLAN / ED COURSE  Pertinent labs & imaging results that were available during my care of the patient were reviewed by me and considered in my medical decision making (see  chart for details).  Review of the [place] CSRS was performed in accordance of the [facility] prior to dispensing any controlled drugs.           Patient's diagnosis is consistent with recurrent pilonidal cyst with current infection.  Patient presents emergency department with purulent drainage and pain to a recurrent pilonidal cyst.  She has had symptoms for 6 to 7 years with recurrence every 1 to 2 months.  No systemic complaints of fevers or chills, abdominal pain.  Has been draining a mild amount of purulent material.  There was no gross extension of erythema or edema into the buttocks.  No extension of pain or erythema around the rectum/anus.  Findings are consistent with recurrent pilonidal cyst disease.  Patient will be placed on doxycycline for antibiotic coverage and referred to general surgery for definitive management after infection has been medicated with antibiotics.  Patient is agreeable with this plan.  I will prescribe a short course of pain medication for the patient as well. Patient is given ED precautions to return to the ED for any worsening or new symptoms.     ____________________________________________  FINAL CLINICAL IMPRESSION(S) /  ED DIAGNOSES  Final diagnoses:  Chronic recurrent pilonidal cyst      NEW MEDICATIONS STARTED DURING THIS VISIT:  ED Discharge Orders         Ordered    doxycycline (VIBRA-TABS) 100 MG tablet  2 times daily        07/22/20 2109    HYDROcodone-acetaminophen (NORCO/VICODIN) 5-325 MG tablet  Every 4 hours PRN        07/22/20 2109              This chart was dictated using voice recognition software/Dragon. Despite best efforts to proofread, errors can occur which can change the meaning. Any change was purely unintentional.    Racheal Patches, PA-C 07/22/20 2109    Sharyn Creamer, MD 07/22/20 2316

## 2020-07-22 NOTE — ED Triage Notes (Signed)
Pt to ED from home c/o having pilonidal cyst for a few years that is now draining for a couple days.  States nausea and fever at home, hx of same feeling with repeat episodes of cellulitis.  States cyst ruptured on its own.  No drainage noted at this time, skin WNL.

## 2020-07-30 ENCOUNTER — Ambulatory Visit (INDEPENDENT_AMBULATORY_CARE_PROVIDER_SITE_OTHER): Payer: Self-pay | Admitting: General Surgery

## 2020-07-30 ENCOUNTER — Other Ambulatory Visit: Payer: Self-pay

## 2020-07-30 ENCOUNTER — Encounter: Payer: Self-pay | Admitting: General Surgery

## 2020-07-30 VITALS — BP 187/101 | HR 123 | Temp 101.0°F | Ht 63.0 in | Wt 281.6 lb

## 2020-07-30 DIAGNOSIS — L0591 Pilonidal cyst without abscess: Secondary | ICD-10-CM

## 2020-07-30 NOTE — Progress Notes (Signed)
Patient ID: Heather Johnston, female   DOB: [birth year], 35 y.o.   MRN: [id number]  Chief Complaint  Patient presents with  . Other    New Patient- Pilonidal cyst -seen in ED    HPI Heather Johnston is a 35 y.o. female.   She is here today as a follow-up from an emergency department visit on July 22, 2020 for a pilonidal cyst.  She has had problems with recurrent abscesses over many years.  She has had several incision and drainage procedures, without any formal excision.  Her most recent ED visit was secondary to increased pain, swelling and drainage of thin yellow pus.  She declined incision and drainage in the emergency room, but agreed to accept a referral to general surgery.  She was placed on a course of doxycycline which she has nearly completed.  At the time of her ER visit, she was not experiencing any fevers or chills, nor any nausea or vomiting.  Today, however she reports fevers, fatigue, sweating, and she is febrile on evaluation.  She is not short of breath and pulse oximetry is normal.  She denies any known recent Covid positive contacts.   Past Medical History:  Diagnosis Date  . Hypertension   . Lymphedema     Past Surgical History:  Procedure Laterality Date  . KIDNEY SURGERY      Family History  Problem Relation Age of Onset  . Hypertension Father   . Kidney failure Father     Social History Social History   Tobacco Use  . Smoking status: Current Every Day Smoker    Packs/day: 0.50    Years: 10.00    Pack years: 5.00    Types: Cigarettes  . Smokeless tobacco: Never Used  Substance Use Topics  . Alcohol use: Yes    Comment: occ  . Drug use: No    Allergies  Allergen Reactions  . Bactrim [Sulfamethoxazole-Trimethoprim] Hives  . Latex   . Penicillins Hives  . Toradol [Ketorolac Tromethamine]     Current Outpatient Medications  Medication Sig Dispense Refill  . doxycycline (VIBRA-TABS) 100 MG tablet Take 1 tablet (100 mg total) by mouth 2 (two)  times daily. 14 tablet 0  . hydrochlorothiazide (HYDRODIURIL) 50 MG tablet Take 1 tablet (50 mg total) by mouth daily. 15 tablet 0  . HYDROcodone-acetaminophen (NORCO/VICODIN) 5-325 MG tablet Take 1 tablet by mouth every 4 (four) hours as needed for moderate pain. 8 tablet 0  . Multiple Vitamin (MULTIVITAMIN WITH MINERALS) TABS tablet Take 1 tablet by mouth daily.     No current facility-administered medications for this visit.    Review of Systems Review of Systems  Constitutional: Positive for chills, diaphoresis, fatigue and fever.  HENT: Positive for tinnitus.   Cardiovascular: Positive for leg swelling.  All other systems reviewed and are negative.   Blood pressure (!) 187/101, pulse (!) 123, temperature (!) 101 F (38.3 C), temperature source Oral, height 5\' 3"  (1.6 m), weight 281 lb 9.6 oz (127.7 kg), last menstrual period 07/03/2020, SpO2 98 %. Body mass index is 49.88 kg/m.  Physical Exam Physical Exam Constitutional:      Appearance: She is obese. She is ill-appearing.  Pulmonary:     Effort: Pulmonary effort is normal.  Neurological:     General: No focal deficit present.  Psychiatric:        Mood and Affect: Mood normal.        Behavior: Behavior normal.    Due to concern  for the patient potentially being Covid positive, I limited my physical contact examination to the gluteal cleft.  There are 2 visible sinuses at the apex of the natal cleft.  There is a firm area just cranial and to the left consistent with prior episodes of inflammation.  There is no induration, inflammation, or erythema.  There is no expressible pus or fluid present.  Data Reviewed I reviewed the electronic medical record and identified multiple visits to the emergency room, both at [facility] and [facility] for this same issue.  No recent relevant labs available for review.  The last set of labs were from May 30, 2020, when she presented for hemoptysis and was diagnosed with bronchitis.  She  was Covid negative at that time.  Assessment This is a 35 year old woman with pilonidal disease.  She has never had a formal excision of the area, but has had a number of incision and drainage procedures.  At this time, the area appears quiesced sent and there is no drainable fluid collection present.  She is in the process of completing a course of antibiotics.  She is interested in surgical intervention.  In addition, she has new onset fever, chills, and generalized malaise.  In the setting of omicron-variant Covid, I am concerned that she may have infection.  I have advised her to contact her primary care provider immediately or go to urgent care for evaluation.  Plan We will schedule her for examination under anesthesia and excision of pilonidal sinus/cyst.  The risks of the operation were discussed with her.  These include, but are not limited to, bleeding, infection, recurrence, wound healing complications, risks of anesthesia.  She agrees to accept these risks and we will proceed with getting her scheduled.    Duanne Guess 07/30/2020, 3:28 PM

## 2020-07-30 NOTE — Patient Instructions (Signed)
Our surgery scheduler Britta Mccreedy will contact you within the next 24-48 hours to discuss the preparation prior to surgery and also discuss the times to align with your schedule. Please have the BLUE sheet when she contacts you. If you have any questions or concerns, please do not hesitate to give our office a call.  Pilonidal Cyst Drainage  A pilonidal cyst is a fluid-filled sac that forms beneath the skin near the tailbone, at the top of the crease of the buttocks (pilonidal area). Incision and drainage is a procedure to open and drain a pilonidal cyst. You may need this procedure if the cyst becomes painful, swollen, or infected (pilonidal abscess). There are three types of procedures to drain a pilonidal cyst. The type of procedure you have will depend on the size, location, and severity of your cyst. You may have:  Incision and drainage with wound packing. Wound packing involves placing a moistened sterile packing material (gauze) into the incision and then covering the wound with an outer bandage (dressing). This method lets the wound continue to drain and helps the wound heal from the inside out.  Marsupialization. This involves opening and draining the cyst, and then stitching (suturing) the wound so that it stays open while it heals. This method helps deep wounds heal from the inside out.  Incision and drainage without wound packing. This incision is closed and covered with a dressing on the outside. Tell a health care provider about:  Any allergies you have.  All medicines you are taking, including vitamins, herbs, eye drops, creams, and over-the-counter medicines.  Any problems you or family members have had with anesthetic medicines.  Any blood disorders you have.  Any surgeries you have had.  Any medical conditions you have.  Whether you are pregnant or may be pregnant. What are the risks? Generally, this is a safe procedure. However, problems may occur,  including:  Infection.  Bleeding.  Allergic reactions to medicines. What happens before the procedure? Medicines Ask your health care provider about:  Changing or stopping your regular medicines. This is especially important if you are taking diabetes medicines or blood thinners.  Taking medicines such as aspirin and ibuprofen. These medicines can thin your blood. Do not take these medicines unless your health care provider tells you to take them.  Taking over-the-counter medicines, vitamins, herbs, and supplements. Staying hydrated Follow instructions from your health care provider about hydration, which may include:  Up to 2 hours before the procedure - you may continue to drink clear liquids, such as water, clear fruit juice, black coffee, and plain tea.  Eating and drinking restrictions Follow instructions from your health care provider about eating and drinking, which may include:  8 hours before the procedure - stop eating heavy meals or foods, such as meat, fried foods, or fatty foods.  6 hours before the procedure - stop eating light meals or foods, such as toast or cereal.  6 hours before the procedure - stop drinking milk or drinks that contain milk.  2 hours before the procedure - stop drinking clear liquids. General instructions  Follow instructions from your health care provider about bathing the night before or the morning before your procedure.  Ask your health care provider what steps will be taken to help prevent infection. These may include: ? Removing hair at the surgery site. ? Washing skin with a germ-killing soap. ? Antibiotic medicine.  Plan to have someone take you home from the hospital or clinic.  Plan to  have a responsible adult care for you for at least 24 hours after you leave the hospital or clinic. This is important.  You will need to have help from a caregiver after the procedure in order to manage wound care and dressing changes. Depending  on the type of procedure you have, this may be needed for a period of days or weeks after your procedure. What happens during the procedure?  An IV may be inserted into one of your veins.  You will be given one or more of the following: ? A medicine to help you relax (sedative). ? A medicine to numb the area (local anesthetic). ? A medicine to make you fall asleep (general anesthetic).  You will lie down on your stomach.  Tape may be used to spread your buttocks, if needed.  Your pilonidal area will be cleaned with a germ-killing solution.  An incision will be made in the cyst.  A tube with a light and camera on the end (probe) may be used to see how deep the wound is.  Fluid or pus inside the cyst will be drained.  The cyst will be flushed out (irrigated) with a germ-free (sterile) solution. The rest of the procedure will depend on what type of procedure you are having. If you are having incision and drainage with wound packing:  Sterile packing material will be placed into the wound. This keeps the wound open so that it can continue to drain after surgery.  The area will be covered with a bandage (dressing). If you are having marsupialization:  The edges of the incision will be sutured to the surrounding skin to keep the wound open.  A dressing will be placed over the wound. If you are having incision and drainage without wound packing:  Some tissue around the opened cyst may be removed.  The incision may be closed with sutures, skin glue, or adhesive strips.  The incision will be covered with a sterile dressing. These procedures may vary among health care providers and hospitals. What happens after the procedure?  Your blood pressure, heart rate, breathing rate, and blood oxygen level will be monitored until you leave the hospital or clinic.  You will be given pain medicine if you need it.  Do not drive for 24 hours if you were given a sedative during your  procedure.  You and your caregiver will be given instructions about how to care for your wound and any dressings you may have. Summary  A pilonidal cyst is a fluid-filled sac that forms beneath the skin near the tailbone, at the top of the crease of the buttocks (pilonidal area).  Incision and drainage is a procedure to open and drain a pilonidal cyst. The type of procedure you have will depend on the size, location, and severity of your cyst.  You may need this procedure if your cyst becomes painful, swollen, or infected (pilonidal abscess). This information is not intended to replace advice given to you by your health care provider. Make sure you discuss any questions you have with your health care provider. Document Revised: 05/03/2018 Document Reviewed: 07/03/2017 Elsevier Patient Education  2020 [employer].

## 2020-07-31 ENCOUNTER — Telehealth: Payer: Self-pay | Admitting: General Surgery

## 2020-07-31 NOTE — Telephone Encounter (Signed)
Have been trying to call the patient to discuss surgery dates, unable to leave a message as her mailbox is full.

## 2020-08-04 ENCOUNTER — Telehealth: Payer: Self-pay | Admitting: General Surgery

## 2020-08-04 NOTE — Telephone Encounter (Signed)
Patient has been advised of Pre-Admission date/time, COVID Testing date and Surgery date. ° °Surgery Date: 08/17/20 °Preadmission Testing Date: 08/11/20 (phone 8a-1p) °Covid Testing Date: 08/14/20 - patient advised to go to [facility] ([address] [place]) between 8a-1p ° ° °Patient has been made aware to call [phone], between 1-3:00pm the day before surgery, to find out what time to arrive for surgery.   ° °

## 2020-08-04 NOTE — Telephone Encounter (Signed)
Multiple attempts have been made in trying to reach the patient to discuss surgical dates.  Once was able to leave a message, other times when attempting to call mailbox is full.

## 2020-08-04 NOTE — Telephone Encounter (Signed)
Tried calling the patient back, she answers the phone this time.  When asked if this is Heather Johnston she says yes.  Then states "I don't speak english" and hangs up.

## 2020-08-11 ENCOUNTER — Encounter
Admission: RE | Admit: 2020-08-11 | Discharge: 2020-08-11 | Disposition: A | Discharge status: Home / Self Care | Payer: Medicaid Other | Source: Ambulatory Visit | Attending: General Surgery | Admitting: General Surgery

## 2020-08-11 ENCOUNTER — Other Ambulatory Visit: Payer: Self-pay

## 2020-08-11 ENCOUNTER — Encounter (HOSPITAL_COMMUNITY): Payer: Self-pay | Admitting: Urgent Care

## 2020-08-11 DIAGNOSIS — Z01818 Encounter for other preprocedural examination: Secondary | ICD-10-CM | POA: Insufficient documentation

## 2020-08-11 HISTORY — DX: Unspecified asthma, uncomplicated: J45.909

## 2020-08-11 NOTE — Progress Notes (Signed)
  [facility] Perioperative Services: Pre-Admission/Anesthesia Testing     Date: 08/11/20  Name: Heather Johnston MRN:   [id number]  Re: Preoperative HTN management   Case: [id number] Date/Time: 08/17/20 0955   Procedure: CYST EXCISION PILONIDAL SIMPLE/sinus (N/A )   Anesthesia type: General   Pre-op diagnosis: pilonidal cyst   Location: [facility] OR ROOM 05 / [facility] ORS FOR ANESTHESIA GROUP   Surgeons: Duanne Guess, MD    Patient is scheduled for the above procedure on 08/17/2020 with Dr. Duanne Guess, MD.  In review of patient's available medical records for preanesthesia clearance, it was discovered the patient has an essential HTN diagnosis.  Patient on thiazide diuretic monotherapy (HCTZ 50 mg daily), however there is a documented history of noncompliance.  Last several blood pressures have been trended as follows:  Date Location Blood Pressure Reading  04/24/2020 [facility] ED 178/126  05/30/2020 [facility] ED 190/130  06/11/2020 [facility] ED 225/172  07/22/2020 [facility] 187/117  07/30/2020 [facility] 187/101    Patient with (+) risk factors for CKD including:   HTN  Smoking (5 pack years)  Obesity (49.9 kg/m)  African American race  Previous "kidney surgery" as a child (procedure unknown)  Family history of renal disease; father is on hemodialysis   Patient with steady decline and renal function since at least 2014.  Creatinine has ranged from 1.07-1.66 mg/dL. 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    I reached out to the patient's outpatient pharmacy to obtain a fill history on her prescribed antihypertensive medication. I will addend note if I am able to obtain this information. Patient reports that she is taking her medication as prescribed.   Given the elective nature of the anticipated procedure it is felt that better preoperative blood pressure management is warranted in  order to ensure a safe and effective anesthetic course. Having the patient undergo general anesthesia at with her current uncontrolled HTN places her at increased risk for perioperative complications. Will forward note to primary attending surgeon for review. No changes are being made to the surgical schedule at this time as patient does have sufficient time to see her PCP for evaluation and management.   Quentin Mulling, MSN, APRN, FNP-C, CEN [facility]  Peri-operative Services Nurse Practitioner Phone: [phone] 08/11/20 4:48 PM

## 2020-08-11 NOTE — Patient Instructions (Addendum)
Your procedure is scheduled on: 08/17/20- MONDAY Report to the Registration Desk on the 1st floor of the [facility]. To find out your arrival time, please call [phone] between 1PM - 3PM on: 08/14/20- FRIDAY  REMEMBER: Instructions that are not followed completely may result in serious medical risk, up to and including death; or upon the discretion of your surgeon and anesthesiologist your surgery may need to be rescheduled.  Do not eat food after midnight the night before surgery.  No gum chewing, lozengers or hard candies.  You may however, drink CLEAR liquids up to 2 hours before you are scheduled to arrive for your surgery. Do not drink anything within 2 hours of your scheduled arrival time.  Clear liquids include: - water  - apple juice without pulp - gatorade (not RED, PURPLE, OR BLUE) - black coffee or tea (Do NOT add milk or creamers to the coffee or tea) Do NOT drink anything that is not on this list.  TAKE THESE MEDICATIONS THE MORNING OF SURGERY WITH A SIP OF WATER: NA  One week prior to surgery: Stop Anti-inflammatories (NSAIDS) such as Advil, Aleve, Ibuprofen, Motrin, Naproxen, Naprosyn and Aspirin based products such as Excedrin, Goodys Powder, BC Powder.   Stop ANY OVER THE COUNTER supplements until after surgery. (However, you may continue taking Vitamin D, Vitamin B, and multivitamin up until the day before surgery.)  No Alcohol for 24 hours before or after surgery.  No Smoking including e-cigarettes for 24 hours prior to surgery.  No chewable tobacco products for at least 6 hours prior to surgery.  No nicotine patches on the day of surgery.  Do not use any "recreational" drugs for at least a week prior to your surgery.  Please be advised that the combination of cocaine and anesthesia may have negative outcomes, up to and including death. If you test positive for cocaine, your surgery will be cancelled.  On the morning of surgery brush your teeth with  toothpaste and water, you may rinse your mouth with mouthwash if you wish. Do not swallow any toothpaste or mouthwash.  Do not wear jewelry, make-up, hairpins, clips or nail polish.  Do not wear lotions, powders, or perfumes.   Do not shave body from the neck down 48 hours prior to surgery just in case you cut yourself which could leave a site for infection.  Also, freshly shaved skin may become irritated if using the CHG soap.  Contact lenses, hearing aids and dentures may not be worn into surgery.  Do not bring valuables to the hospital. [facility] is not responsible for any missing/lost belongings or valuables.   Use CHG Soap or wipes as directed on instruction sheet.  Notify your doctor if there is any change in your medical condition (cold, fever, infection).  Wear comfortable clothing (specific to your surgery type) to the hospital.  Plan for stool softeners for home use; pain medications have a tendency to cause constipation. You can also help prevent constipation by eating foods high in fiber such as fruits and vegetables and drinking plenty of fluids as your diet allows.  After surgery, you can help prevent lung complications by doing breathing exercises.  Take deep breaths and cough every 1-2 hours. Your doctor may order a device called an Incentive Spirometer to help you take deep breaths. When coughing or sneezing, hold a pillow firmly against your incision with both hands. This is called "splinting." Doing this helps protect your incision. It also decreases belly discomfort.  If you are being admitted to the hospital overnight, leave your suitcase in the car. After surgery it may be brought to your room.  If you are being discharged the day of surgery, you will not be allowed to drive home. You will need a responsible adult (18 years or older) to drive you home and stay with you that night.   If you are taking public transportation, you will need to have a responsible  adult (18 years or older) with you. Please confirm with your physician that it is acceptable to use public transportation.   Please call the Pre-admissions Testing Dept. at [phone] if you have any questions about these instructions.  Visitation Policy:  Patients undergoing a surgery or procedure may have one family member or support person with them as long as that person is not COVID-19 positive or experiencing its symptoms.  That person may remain in the waiting area during the procedure.  Inpatient Visitation:    Visiting hours are 7 a.m. to 8 p.m. Patients will be allowed one visitor. The visitor may change daily. The visitor must pass COVID-19 screenings, use hand sanitizer when entering and exiting the patient's room and wear a mask at all times, including in the patient's room. Patients must also wear a mask when staff or their visitor are in the room. Masking is required regardless of vaccination status. Systemwide, no visitors 17 or younger.

## 2020-08-12 ENCOUNTER — Telehealth: Payer: Self-pay

## 2020-08-12 NOTE — Telephone Encounter (Signed)
Per Dr Lady Gary she would like the patient to be seen by her PCP to initiate better control of her blood pressure prior to surgery. I have left the patient a message about this.

## 2020-08-14 ENCOUNTER — Other Ambulatory Visit: Admission: RE | Admit: 2020-08-14 | Payer: Medicaid Other | Source: Ambulatory Visit

## 2020-08-14 ENCOUNTER — Telehealth: Payer: Self-pay | Admitting: General Surgery

## 2020-08-14 NOTE — Telephone Encounter (Signed)
Incoming call from the OR, per pre-admissions patient has no showed her EKG and covid test today (08/14/20).  Patient has been made aware of this initially and voiced understanding the importance of getting this done prior to surgery.  Tried calling the patient, left a detailed message that her surgery for 08/17/20 has been cancelled and that she is to call the office immediately.

## 2020-08-17 ENCOUNTER — Encounter: Admission: RE | Payer: Self-pay | Source: Home / Self Care

## 2020-08-17 ENCOUNTER — Ambulatory Visit: Admission: RE | Admit: 2020-08-17 | Payer: Medicaid Other | Source: Home / Self Care | Admitting: General Surgery

## 2020-08-17 SURGERY — EXCISION, SIMPLE PILONIDAL CYST
Anesthesia: General

## 2020-12-11 ENCOUNTER — Encounter: Payer: Self-pay | Admitting: Emergency Medicine

## 2020-12-11 ENCOUNTER — Other Ambulatory Visit: Payer: Self-pay

## 2020-12-11 ENCOUNTER — Emergency Department
Admission: EM | Admit: 2020-12-11 | Discharge: 2020-12-11 | Disposition: A | Discharge status: Home / Self Care | Payer: Medicaid Other | Attending: Emergency Medicine | Admitting: Emergency Medicine

## 2020-12-11 DIAGNOSIS — F17210 Nicotine dependence, cigarettes, uncomplicated: Secondary | ICD-10-CM | POA: Insufficient documentation

## 2020-12-11 DIAGNOSIS — L03114 Cellulitis of left upper limb: Secondary | ICD-10-CM | POA: Insufficient documentation

## 2020-12-11 DIAGNOSIS — Z79899 Other long term (current) drug therapy: Secondary | ICD-10-CM | POA: Insufficient documentation

## 2020-12-11 DIAGNOSIS — I10 Essential (primary) hypertension: Secondary | ICD-10-CM | POA: Insufficient documentation

## 2020-12-11 DIAGNOSIS — Z91040 Latex allergy status: Secondary | ICD-10-CM | POA: Insufficient documentation

## 2020-12-11 DIAGNOSIS — J45909 Unspecified asthma, uncomplicated: Secondary | ICD-10-CM | POA: Insufficient documentation

## 2020-12-11 MED ORDER — HYDROCODONE-ACETAMINOPHEN 5-325 MG PO TABS
1.0000 | ORAL_TABLET | Freq: Four times a day (QID) | ORAL | 0 refills | Status: AC | PRN
Start: 2020-12-11 — End: 2020-12-14

## 2020-12-11 MED ORDER — CLINDAMYCIN HCL 300 MG PO CAPS: 300.0000 mg | ORAL_CAPSULE | Freq: Three times a day (TID) | ORAL | 0 refills | Status: AC

## 2020-12-11 MED ORDER — TRAMADOL HCL 50 MG PO TABS: 50.0000 mg | ORAL_TABLET | Freq: Four times a day (QID) | ORAL | 0 refills | Status: DC | PRN

## 2020-12-11 NOTE — ED Provider Notes (Signed)
[facility] Emergency Department Provider Note  ____________________________________________  Time seen: Approximately 1:48 PM  I have reviewed the triage vital signs and the nursing notes.   HISTORY  Chief Complaint Insect Bite   HPI Heather Johnston is a 35 y.o. female who presents emergency department for treatment and evaluation of a tender, painful area to the left elbow.  She noticed it 2 days ago.  She has accidentally hit the elbow twice and has had purulent drainage coming from that area those times.  She is unsure what has caused the infection.  She did not actually feel like anything bite her.  She denies fever chills or nausea.   Past Medical History:  Diagnosis Date  . Asthma    AS A CHILD  . Hypertension   . Lymphedema     There are no problems to display for this patient.   Past Surgical History:  Procedure Laterality Date  . KIDNEY SURGERY      Prior to Admission medications   Medication Sig Start Date End Date Taking? Authorizing Provider  clindamycin (CLEOCIN) 300 MG capsule Take 1 capsule (300 mg total) by mouth 3 (three) times daily for 10 days. 12/11/20 12/21/20 Yes Kaydn Kumpf B, FNP  traMADol (ULTRAM) 50 MG tablet Take 1 tablet (50 mg total) by mouth every 6 (six) hours as needed. 12/11/20  Yes Conny Situ B, FNP  hydrochlorothiazide (HYDRODIURIL) 50 MG tablet Take 1 tablet (50 mg total) by mouth daily. 08/29/16   Myrna Blazer, MD    Allergies Bactrim [sulfamethoxazole-trimethoprim], Latex, Penicillins, and Toradol [ketorolac tromethamine]  Family History  Problem Relation Age of Onset  . Hypertension Father   . Kidney failure Father     Social History Social History   Tobacco Use  . Smoking status: Current Every Day Smoker    Packs/day: 0.50    Years: 10.00    Pack years: 5.00    Types: Cigarettes  . Smokeless tobacco: Never Used  Substance Use Topics  . Alcohol use: Yes    Comment: occ  . Drug  use: No    Review of Systems  Constitutional: Negative for fever. Respiratory: Negative for cough or shortness of breath.  Musculoskeletal: Negative for myalgias Skin: Positive for lesion to the left elbow Neurological: Negative for numbness or paresthesias. ____________________________________________   PHYSICAL EXAM:  VITAL SIGNS: ED Triage Vitals  Enc Vitals Group     BP 12/11/20 1056 (!) 169/119     Pulse Rate 12/11/20 1056 99     Resp 12/11/20 1056 20     Temp 12/11/20 1056 98.4 F (36.9 C)     Temp Source 12/11/20 1056 Oral     SpO2 12/11/20 1056 98 %     Weight 12/11/20 1054 264 lb (119.7 kg)     Height 12/11/20 1054 5\' 3"  (1.6 m)     Head Circumference --      Peak Flow --      Pain Score 12/11/20 1054 6     Pain Loc --      Pain Edu? --      Excl. in GC? --      Constitutional: Well appearing. Eyes: Conjunctivae are clear without discharge or drainage. Nose: No rhinorrhea noted. Mouth/Throat: Airway is patent.  Neck: No stridor. Unrestricted range of motion observed. Cardiovascular: Capillary refill is <3 seconds.  Respiratory: Respirations are even and unlabored.. Musculoskeletal: Unrestricted range of motion observed. Neurologic: Awake, alert, and oriented x 4.  Skin: Erythematous area of the left lateral elbow measuring approximately 6 cm  ____________________________________________   LABS (all labs ordered are listed, but only abnormal results are displayed)  Labs Reviewed - No data to display ____________________________________________  EKG  Not indicated. ____________________________________________  RADIOLOGY  Not indicated. ____________________________________________   PROCEDURES  Procedures ____________________________________________   INITIAL IMPRESSION / ASSESSMENT AND PLAN / ED COURSE  Heather Johnston is a 35 y.o. female who presents to the emergency department for treatment and evaluation of lesion to the left  forearm. See HPI.   No fluctuant area for I&D.  She will be placed on clindamycin due to her bactrim allergy. She was encouraged to use warm compresses often through the day to encourage drainage. She is to follow up with primary care or return to the ER for symptoms of concern.   Medications - No data to display   Pertinent labs & imaging results that were available during my care of the patient were reviewed by me and considered in my medical decision making (see chart for details).  ____________________________________________   FINAL CLINICAL IMPRESSION(S) / ED DIAGNOSES  Final diagnoses:  Cellulitis of left upper extremity    ED Discharge Orders         Ordered    clindamycin (CLEOCIN) 300 MG capsule  3 times daily        12/11/20 1357    traMADol (ULTRAM) 50 MG tablet  Every 6 hours PRN        12/11/20 1357           Note:  This document was prepared using Dragon voice recognition software and may include unintentional dictation errors.   Chinita Pester, FNP 12/11/20 1400    Merwyn Katos, MD 12/11/20 [phone]

## 2020-12-11 NOTE — ED Notes (Signed)
See triage note  Presents with insect bite to left elbow area  Noticed area about 2 days ago  Now has redness and swelling

## 2020-12-11 NOTE — Discharge Instructions (Signed)
Please follow-up with primary care for symptoms that are not improving over the next 2 to 3 days.  Return to the emergency department for symptoms of change or worsen if you are unable to schedule an appointment.

## 2020-12-11 NOTE — ED Triage Notes (Signed)
Pt states bite to L lateral elbow 2 days ago, states hit her elbow and "busted it", states small amount of pus came out. Pt c/o redness and warmth to touch.

## 2020-12-11 NOTE — ED Notes (Signed)
Patient is resting in recliner. NAD.

## 2020-12-11 NOTE — ED Notes (Signed)
Patient declined discharge vital signs. 

## 2021-01-13 ENCOUNTER — Other Ambulatory Visit: Payer: Self-pay

## 2021-01-13 ENCOUNTER — Encounter: Payer: Self-pay | Admitting: Emergency Medicine

## 2021-01-13 DIAGNOSIS — N920 Excessive and frequent menstruation with regular cycle: Secondary | ICD-10-CM | POA: Insufficient documentation

## 2021-01-13 DIAGNOSIS — Z91040 Latex allergy status: Secondary | ICD-10-CM | POA: Insufficient documentation

## 2021-01-13 DIAGNOSIS — I10 Essential (primary) hypertension: Secondary | ICD-10-CM | POA: Insufficient documentation

## 2021-01-13 DIAGNOSIS — J45909 Unspecified asthma, uncomplicated: Secondary | ICD-10-CM | POA: Insufficient documentation

## 2021-01-13 DIAGNOSIS — Z79899 Other long term (current) drug therapy: Secondary | ICD-10-CM | POA: Insufficient documentation

## 2021-01-13 LAB — COMPREHENSIVE METABOLIC PANEL
ALT: 21 U/L (ref 0–44)
AST: 24 U/L (ref 15–41)
Albumin: 4.6 g/dL (ref 3.5–5.0)
Alkaline Phosphatase: 64 U/L (ref 38–126)
Anion gap: 6 (ref 5–15)
BUN: 15 mg/dL (ref 6–20)
CO2: 28 mmol/L (ref 22–32)
Calcium: 9.1 mg/dL (ref 8.9–10.3)
Chloride: 104 mmol/L (ref 98–111)
Creatinine, Ser: 1.39 mg/dL — ABNORMAL HIGH (ref 0.44–1.00)
GFR, Estimated: 51 mL/min — ABNORMAL LOW (ref >60)
Glucose, Bld: 110 mg/dL — ABNORMAL HIGH (ref 70–99)
Potassium: 3.7 mmol/L (ref 3.5–5.1)
Sodium: 138 mmol/L (ref 135–145)
Total Bilirubin: 0.5 mg/dL (ref 0.3–1.2)
Total Protein: 8.4 g/dL — ABNORMAL HIGH (ref 6.5–8.1)

## 2021-01-13 LAB — TYPE AND SCREEN
ABO/RH(D): A POS
Antibody Screen: NEGATIVE

## 2021-01-13 LAB — LIPASE, BLOOD: Lipase: 35 U/L (ref 11–51)

## 2021-01-13 LAB — CBC
HCT: 36.9 % (ref 36.0–46.0)
Hemoglobin: 12.8 g/dL (ref 12.0–15.0)
MCH: 29.6 pg (ref 26.0–34.0)
MCHC: 34.7 g/dL (ref 30.0–36.0)
MCV: 85.4 fL (ref 80.0–100.0)
Platelets: 321 10*3/uL (ref 150–400)
RBC: 4.32 MIL/uL (ref 3.87–5.11)
RDW: 14 % (ref 11.5–15.5)
WBC: 9.6 10*3/uL (ref 4.0–10.5)
nRBC: 0 % (ref 0.0–0.2)

## 2021-01-13 LAB — POC URINE PREG, ED: Preg Test, Ur: NEGATIVE

## 2021-01-13 NOTE — ED Triage Notes (Signed)
Pt to ED from home c/o abd pain and cramping today, vaginal bleeding and passing several large blood clots, denies urinary changes.  States thought may be normal menstrual cycle but heavier than normal.  Pt A&Ox4, skin color WNL, in NAD at this time.

## 2021-01-14 ENCOUNTER — Emergency Department
Admission: EM | Admit: 2021-01-14 | Discharge: 2021-01-14 | Disposition: A | Discharge status: Home / Self Care | Payer: Medicaid Other | Attending: Emergency Medicine | Admitting: Emergency Medicine

## 2021-01-14 DIAGNOSIS — N920 Excessive and frequent menstruation with regular cycle: Secondary | ICD-10-CM

## 2021-01-14 DIAGNOSIS — I10 Essential (primary) hypertension: Secondary | ICD-10-CM

## 2021-01-14 LAB — URINALYSIS, COMPLETE (UACMP) WITH MICROSCOPIC
Bacteria, UA: NONE SEEN
Bilirubin Urine: NEGATIVE
Glucose, UA: 50 mg/dL — AB
Ketones, ur: NEGATIVE mg/dL
Leukocytes,Ua: NEGATIVE
Nitrite: NEGATIVE
Protein, ur: 30 mg/dL — AB
RBC / HPF: >50 RBC/hpf — ABNORMAL HIGH (ref 0–5)
Specific Gravity, Urine: 1.02 (ref 1.005–1.030)
pH: 5 (ref 5.0–8.0)

## 2021-01-14 MED ORDER — HYDROCHLOROTHIAZIDE 50 MG PO TABS: 50.0000 mg | ORAL_TABLET | Freq: Every day | ORAL | 1 refills | Status: DC

## 2021-01-14 MED ORDER — CLONIDINE HCL 0.1 MG PO TABS
0.2000 mg | ORAL_TABLET | Freq: Once | ORAL | Status: AC
  Administered 2021-01-14: 0.2 mg via ORAL
  Filled 2021-01-14: qty 2

## 2021-01-14 MED ORDER — HYDROCHLOROTHIAZIDE 50 MG PO TABS: 50.0000 mg | ORAL_TABLET | Freq: Every day | ORAL | 1 refills | Status: AC

## 2021-01-14 NOTE — ED Provider Notes (Signed)
[facility] Emergency Department Provider Note ____________________________________________   Event Date/Time   First MD Initiated Contact with Patient 01/14/21 0128     (approximate)  I have reviewed the triage vital signs and the nursing notes.   HISTORY  Chief Complaint Abdominal Cramping and Vaginal Bleeding    HPI Heather Johnston is a 35 y.o. female with PMH as noted below including a history of hypertension who presents with vaginal bleeding since yesterday, worsened today, and associated with bilateral lower abdominal crampy pain.  The patient states that she thinks she may just be having a heavy period, but has not had bleeding this heavy previously.  She has passed some clots but denies passing any tissue.  At times she has had to change a pad once an hour.  She has no vaginal discharge.  She denies any weakness or lightheadedness.  The patient reports that she used to be on hydrochlorothiazide for hypertension but has not taken it in some time.  She denies any chest pain, headache, shortness of breath, or other acute symptoms.  Past Medical History:  Diagnosis Date   Asthma    AS A CHILD   Hypertension    Lymphedema     There are no problems to display for this patient.   Past Surgical History:  Procedure Laterality Date   KIDNEY SURGERY      Prior to Admission medications   Medication Sig Start Date End Date Taking? Authorizing Provider  hydrochlorothiazide (HYDRODIURIL) 50 MG tablet Take 1 tablet (50 mg total) by mouth daily. 01/14/21 03/15/21  Dionne Bucy, MD    Allergies Bactrim [sulfamethoxazole-trimethoprim], Latex, Penicillins, and Tramadol  Family History  Problem Relation Age of Onset   Hypertension Father    Kidney failure Father     Social History Social History   Tobacco Use   Smoking status: Every Day    Packs/day: 0.50    Years: 10.00    Pack years: 5.00    Types: Cigarettes   Smokeless tobacco: Never   Substance Use Topics   Alcohol use: Yes    Comment: occ   Drug use: No    Review of Systems  Constitutional: No fever. Eyes: No redness. ENT: No sore throat. Cardiovascular: Denies chest pain. Respiratory: Denies shortness of breath. Gastrointestinal: No vomiting or diarrhea.  Genitourinary: Negative for dysuria.  Positive for vaginal bleeding. Musculoskeletal: Negative for back pain. Skin: Negative for rash. Neurological: Negative for headache.   ____________________________________________   PHYSICAL EXAM:  VITAL SIGNS: ED Triage Vitals  Enc Vitals Group     BP 01/13/21 2225 (!) 218/110     Pulse Rate 01/13/21 2225 (!) 102     Resp 01/13/21 2225 18     Temp 01/13/21 2225 98.7 F (37.1 C)     Temp Source 01/13/21 2225 Oral     SpO2 01/13/21 2225 100 %     Weight 01/13/21 2226 264 lb (119.7 kg)     Height 01/13/21 2226 5\' 3"  (1.6 m)     Head Circumference --      Peak Flow --      Pain Score 01/13/21 2226 10     Pain Loc --      Pain Edu? --      Excl. in GC? --     Constitutional: Alert and oriented. Well appearing and in no acute distress. Eyes: Conjunctivae are normal.  Head: Atraumatic. Nose: No congestion/rhinnorhea. Mouth/Throat: Mucous membranes are moist.   Neck:  Normal range of motion.  Cardiovascular: Normal rate, regular rhythm. Good peripheral circulation. Respiratory: Normal respiratory effort.  No retractions.  Gastrointestinal: Soft and nontender. No distention.  Genitourinary: No flank tenderness. Musculoskeletal: No lower extremity edema.  Extremities warm and well perfused.  Neurologic:  Normal speech and language. No gross focal neurologic deficits are appreciated.  Skin:  Skin is warm and dry. No rash noted. Psychiatric: Mood and affect are normal. Speech and behavior are normal.  ____________________________________________   LABS (all labs ordered are listed, but only abnormal results are displayed)  Labs Reviewed   COMPREHENSIVE METABOLIC PANEL - Abnormal; Notable for the following components:      Result Value   Glucose, Bld 110 (*)    Creatinine, Ser 1.39 (*)    Total Protein 8.4 (*)    GFR, Estimated 51 (*)    All other components within normal limits  URINALYSIS, COMPLETE (UACMP) WITH MICROSCOPIC - Abnormal; Notable for the following components:   Color, Urine YELLOW (*)    APPearance CLEAR (*)    Glucose, UA 50 (*)    Hgb urine dipstick LARGE (*)    Protein, ur 30 (*)    RBC / HPF >50 (*)    All other components within normal limits  LIPASE, BLOOD  CBC  POC URINE PREG, ED  TYPE AND SCREEN   ____________________________________________  EKG   ____________________________________________  RADIOLOGY    ____________________________________________   PROCEDURES  Procedure(s) performed: No  Procedures  Critical Care performed: No ____________________________________________   INITIAL IMPRESSION / ASSESSMENT AND PLAN / ED COURSE  Pertinent labs & imaging results that were available during my care of the patient were reviewed by me and considered in my medical decision making (see chart for details).   35 year old female with PMH as noted above presents with a period starting yesterday with abnormally heavy bleeding today along with lower abdominal cramping.  She denies any chest pain or other acute symptoms.  On exam, the patient is overall well-appearing and appeared to be sleeping comfortably when I went to examine her.  She is significantly hypertensive but her vital signs are otherwise normal.  The physical exam is otherwise unremarkable.  The abdomen is soft and nontender.  The patient declined pelvic exam.  Vaginal bleeding: This is most consistent with menorrhagia as it was associated with the onset of her normal period.  Her pregnancy test is negative.  Hemoglobin is normal.  There is no evidence of significant blood loss.  I explained to the patient that it would be  helpful to do a pelvic exam to evaluate for the source of bleeding, the patient demonstrates appropriate understanding but declines pelvic exam at this time.  Given the stable hemoglobin intermittent cramping (rather than constant pain) and benign exam, there is no indication for emergent ultrasound or other imaging.  Hypertension: The patient has a known history of hypertension and was previously prescribed hydrochlorothiazide but has been noncompliant.  She denies any chest pain, severe headache, or other symptoms of end organ dysfunction.  Lab work-up is unremarkable and her creatinine is unchanged from prior.  There is no evidence of hypertensive emergency.  I recommended giving a dose of clonidine here in the ED and restarting the patient on HCTZ.  She agrees with this plan although does not want to wait for a blood pressure recheck.  At this time, the patient is stable for discharge home.  I counseled her on the results of the work-up.  I have  restarted her on hydrochlorothiazide and given her OB/GYN referral.  Return precautions given, she expresses understanding  ____________________________________________   FINAL CLINICAL IMPRESSION(S) / ED DIAGNOSES  Final diagnoses:  Menorrhagia with regular cycle  Hypertension, unspecified type      NEW MEDICATIONS STARTED DURING THIS VISIT:  Current Discharge Medication List       Note:  This document was prepared using Dragon voice recognition software and may include unintentional dictation errors.    Dionne Bucy, MD 01/14/21 [phone]

## 2021-01-14 NOTE — Discharge Instructions (Addendum)
Return to the ER for new, worsening, or persistent heavy bleeding, abdominal cramping or pain does not get better, passing of any tissue, fever or chills, weakness, or any other new or worsening symptoms that concern you.  We have provided the names of two area OB/GYN's for follow-up.  You should follow-up within the next several weeks.  We have given a new prescription for hydrochlorothiazide for 1 month with 1 refill.  In the meantime you should follow-up with a primary care doctor and be restarted on blood pressure medication more permanently.  Return to the ER for chest pain, severe headache, shortness of breath, or persistently high blood pressure readings especially over 180 on the top number or 100 on the bottom number.

## 2021-04-02 ENCOUNTER — Other Ambulatory Visit: Payer: Self-pay

## 2021-04-02 ENCOUNTER — Emergency Department
Admission: EM | Admit: 2021-04-02 | Discharge: 2021-04-02 | Disposition: A | Discharge status: Home / Self Care | Payer: Medicaid Other | Attending: Emergency Medicine | Admitting: Emergency Medicine

## 2021-04-02 ENCOUNTER — Encounter: Payer: Self-pay | Admitting: Emergency Medicine

## 2021-04-02 DIAGNOSIS — F17210 Nicotine dependence, cigarettes, uncomplicated: Secondary | ICD-10-CM | POA: Insufficient documentation

## 2021-04-02 DIAGNOSIS — I10 Essential (primary) hypertension: Secondary | ICD-10-CM | POA: Insufficient documentation

## 2021-04-02 DIAGNOSIS — Z91040 Latex allergy status: Secondary | ICD-10-CM | POA: Insufficient documentation

## 2021-04-02 DIAGNOSIS — L03211 Cellulitis of face: Secondary | ICD-10-CM | POA: Insufficient documentation

## 2021-04-02 DIAGNOSIS — J45909 Unspecified asthma, uncomplicated: Secondary | ICD-10-CM | POA: Insufficient documentation

## 2021-04-02 DIAGNOSIS — Z79899 Other long term (current) drug therapy: Secondary | ICD-10-CM | POA: Insufficient documentation

## 2021-04-02 MED ORDER — CLINDAMYCIN HCL 300 MG PO CAPS: 300.0000 mg | ORAL_CAPSULE | Freq: Four times a day (QID) | ORAL | 0 refills | Status: AC

## 2021-04-02 MED ORDER — HYDROCODONE-ACETAMINOPHEN 5-325 MG PO TABS
1.0000 | ORAL_TABLET | Freq: Once | ORAL | Status: AC
  Administered 2021-04-02: 1 via ORAL
  Filled 2021-04-02: qty 1

## 2021-04-02 MED ORDER — CLINDAMYCIN PHOSPHATE 600 MG/4ML IJ SOLN
600.0000 mg | Freq: Once | INTRAMUSCULAR | Status: DC
  Filled 2021-04-02: qty 4

## 2021-04-02 MED ORDER — HYDROCHLOROTHIAZIDE 50 MG PO TABS
50.0000 mg | ORAL_TABLET | Freq: Every day | ORAL | Status: DC
  Administered 2021-04-02: 50 mg via ORAL
  Filled 2021-04-02: qty 1

## 2021-04-02 MED ORDER — ONDANSETRON 4 MG PO TBDP: 4.0000 mg | ORAL_TABLET | Freq: Three times a day (TID) | ORAL | 0 refills | Status: AC | PRN

## 2021-04-02 MED ORDER — KETOROLAC TROMETHAMINE 30 MG/ML IJ SOLN
30.0000 mg | Freq: Once | INTRAMUSCULAR | Status: AC
  Administered 2021-04-02: 30 mg via INTRAMUSCULAR
  Filled 2021-04-02: qty 1

## 2021-04-02 MED ORDER — CLINDAMYCIN PHOSPHATE 900 MG/6ML IJ SOLN
600.0000 mg | Freq: Once | INTRAMUSCULAR | Status: AC
  Administered 2021-04-02: 600 mg via INTRAMUSCULAR
  Filled 2021-04-02: qty 4

## 2021-04-02 MED ORDER — CLINDAMYCIN PHOSPHATE 300 MG/2ML IJ SOLN
600.0000 mg | Freq: Once | INTRAMUSCULAR | Status: DC
  Filled 2021-04-02: qty 4

## 2021-04-02 MED ORDER — HYDROCODONE-ACETAMINOPHEN 5-325 MG PO TABS: 1.0000 | ORAL_TABLET | Freq: Four times a day (QID) | ORAL | 0 refills | Status: AC | PRN

## 2021-04-02 MED ORDER — ONDANSETRON 4 MG PO TBDP
4.0000 mg | ORAL_TABLET | Freq: Once | ORAL | Status: AC
  Administered 2021-04-02: 4 mg via ORAL
  Filled 2021-04-02: qty 1

## 2021-04-02 NOTE — ED Provider Notes (Signed)
[facility]-EMERGENCY DEPARTMENT  ____________________________________________  Time seen: Approximately 7:21 PM  I have reviewed the triage vital signs and the nursing notes.   HISTORY  Chief Complaint Abscess   Historian Patient     HPI Heather Johnston is a 35 y.o. female presents to the emergency department with right-sided facial cellulitis and has been present for the past 2 to 3 days.  Patient has been afebrile at home.  Denies a history of facial cellulitis in the past.  Patient does have a history of cellulitis of the left upper extremity.  No chest pain, chest tightness or abdominal pain.  No difficulty swallowing or pain underneath the tongue.   Past Medical History:  Diagnosis Date   Asthma    AS A CHILD   Hypertension    Lymphedema      Immunizations up to date:  Yes.     Past Medical History:  Diagnosis Date   Asthma    AS A CHILD   Hypertension    Lymphedema     There are no problems to display for this patient.   Past Surgical History:  Procedure Laterality Date   KIDNEY SURGERY      Prior to Admission medications   Medication Sig Start Date End Date Taking? Authorizing Provider  clindamycin (CLEOCIN) 300 MG capsule Take 1 capsule (300 mg total) by mouth 4 (four) times daily for 7 days. 04/02/21 04/09/21 Yes Pia Mau M, PA-C  HYDROcodone-acetaminophen (NORCO) 5-325 MG tablet Take 1 tablet by mouth every 6 (six) hours as needed for up to 3 days. 04/02/21 04/05/21 Yes Pia Mau M, PA-C  ondansetron (ZOFRAN ODT) 4 MG disintegrating tablet Take 1 tablet (4 mg total) by mouth every 8 (eight) hours as needed for up to 5 days. 04/02/21 04/07/21 Yes Pia Mau M, PA-C  hydrochlorothiazide (HYDRODIURIL) 50 MG tablet Take 1 tablet (50 mg total) by mouth daily. 01/14/21 03/15/21  Dionne Bucy, MD    Allergies Bactrim [sulfamethoxazole-trimethoprim], Latex, Penicillins, and Tramadol  Family History  Problem Relation Age of Onset   Hypertension  Father    Kidney failure Father     Social History Social History   Tobacco Use   Smoking status: Every Day    Packs/day: 0.50    Years: 10.00    Pack years: 5.00    Types: Cigarettes   Smokeless tobacco: Never  Substance Use Topics   Alcohol use: Yes    Comment: occ   Drug use: No     Review of Systems  Constitutional: No fever/chills Eyes:  No discharge ENT: No upper respiratory complaints. Respiratory: no cough. No SOB/ use of accessory muscles to breath Gastrointestinal:   No nausea, no vomiting.  No diarrhea.  No constipation. Musculoskeletal: Negative for musculoskeletal pain. Skin: Patient has facial cellulitis.     ____________________________________________   PHYSICAL EXAM:  VITAL SIGNS: ED Triage Vitals  Enc Vitals Group     BP 04/02/21 1820 (!) 170/120     Pulse Rate 04/02/21 1820 92     Resp 04/02/21 1820 16     Temp 04/02/21 1820 98.6 F (37 C)     Temp Source 04/02/21 1820 Oral     SpO2 04/02/21 1820 100 %     Weight 04/02/21 1736 265 lb (120.2 kg)     Height 04/02/21 1736 5\' 3"  (1.6 m)     Head Circumference --      Peak Flow --      Pain Score 04/02/21 1736  8     Pain Loc --      Pain Edu? --      Excl. in GC? --      Constitutional: Alert and oriented. Well appearing and in no acute distress. Eyes: Conjunctivae are normal. PERRL. EOMI. Head: Atraumatic. ENT:      Nose: No congestion/rhinnorhea.      Mouth/Throat: Mucous membranes are moist.  Neck: No stridor.  No cervical spine tenderness to palpation. Cardiovascular: Normal rate, regular rhythm. Normal S1 and S2.  Good peripheral circulation. Respiratory: Normal respiratory effort without tachypnea or retractions. Lungs CTAB. Good air entry to the bases with no decreased or absent breath sounds Gastrointestinal: Bowel sounds x 4 quadrants. Soft and nontender to palpation. No guarding or rigidity. No distention. Musculoskeletal: Full range of motion to all extremities. No obvious  deformities noted Neurologic:  Normal for age. No gross focal neurologic deficits are appreciated.  Skin: Patient has right-sided facial cellulitis. Psychiatric: Mood and affect are normal for age. Speech and behavior are normal.   ____________________________________________   LABS (all labs ordered are listed, but only abnormal results are displayed)  Labs Reviewed - No data to display ____________________________________________  EKG   ____________________________________________  RADIOLOGY   No results found.  ____________________________________________    PROCEDURES  Procedure(s) performed:     Procedures     Medications  clindamycin (CLEOCIN) injection 600 mg (has no administration in time range)  ketorolac (TORADOL) 30 MG/ML injection 30 mg (has no administration in time range)  HYDROcodone-acetaminophen (NORCO/VICODIN) 5-325 MG per tablet 1 tablet (has no administration in time range)  ondansetron (ZOFRAN-ODT) disintegrating tablet 4 mg (has no administration in time range)     ____________________________________________   INITIAL IMPRESSION / ASSESSMENT AND PLAN / ED COURSE  Pertinent labs & imaging results that were available during my care of the patient were reviewed by me and considered in my medical decision making (see chart for details).      Assessment and Plan:  Facial cellulitis:  34 year old female presents to the emergency department with right-sided facial cellulitis for the past 3 to 4 days.  Patient was hypertensive at triage but vital signs were otherwise reassuring.  Patient was alert, active and nontoxic-appearing.  Patient was given IM clindamycin in the emergency department and discharged with clindamycin 4 times daily.  She was advised to start probiotic.  Return precautions were given to return with new or worsening symptoms.    ____________________________________________  FINAL CLINICAL IMPRESSION(S) / ED  DIAGNOSES  Final diagnoses:  Facial cellulitis      NEW MEDICATIONS STARTED DURING THIS VISIT:  ED Discharge Orders          Ordered    clindamycin (CLEOCIN) 300 MG capsule  4 times daily        04/02/21 1914    HYDROcodone-acetaminophen (NORCO) 5-325 MG tablet  Every 6 hours PRN        04/02/21 1918    ondansetron (ZOFRAN ODT) 4 MG disintegrating tablet  Every 8 hours PRN        04/02/21 1918                This chart was dictated using voice recognition software/Dragon. Despite best efforts to proofread, errors can occur which can change the meaning. Any change was purely unintentional.     Orvil Feil, PA-C 04/02/21 2130    Gilles Chiquito, MD 04/03/21 [phone]

## 2021-04-02 NOTE — ED Triage Notes (Signed)
Pt reports had an ingrown hair on the right side of her face that she was trying to remove and now swollen and painful.

## 2021-04-02 NOTE — Discharge Instructions (Signed)
Take clindamycin 4 times daily for the next 7 days. Please start probiotic.

## 2021-04-16 ENCOUNTER — Encounter: Payer: Self-pay | Admitting: General Surgery

## 2021-05-30 IMAGING — CR DG CHEST 2V
1 series · 2 of 2 positions shown · non-contrast
Comparison: Prior radiograph from 02/16/2013.

CLINICAL DATA: Initial evaluation for acute hemoptysis.

EXAM:
CHEST - 2 VIEW

[Series 1: dg chest 2 view · 0.14mm/px · 2 of 2 slices shown]
[im 1/2]
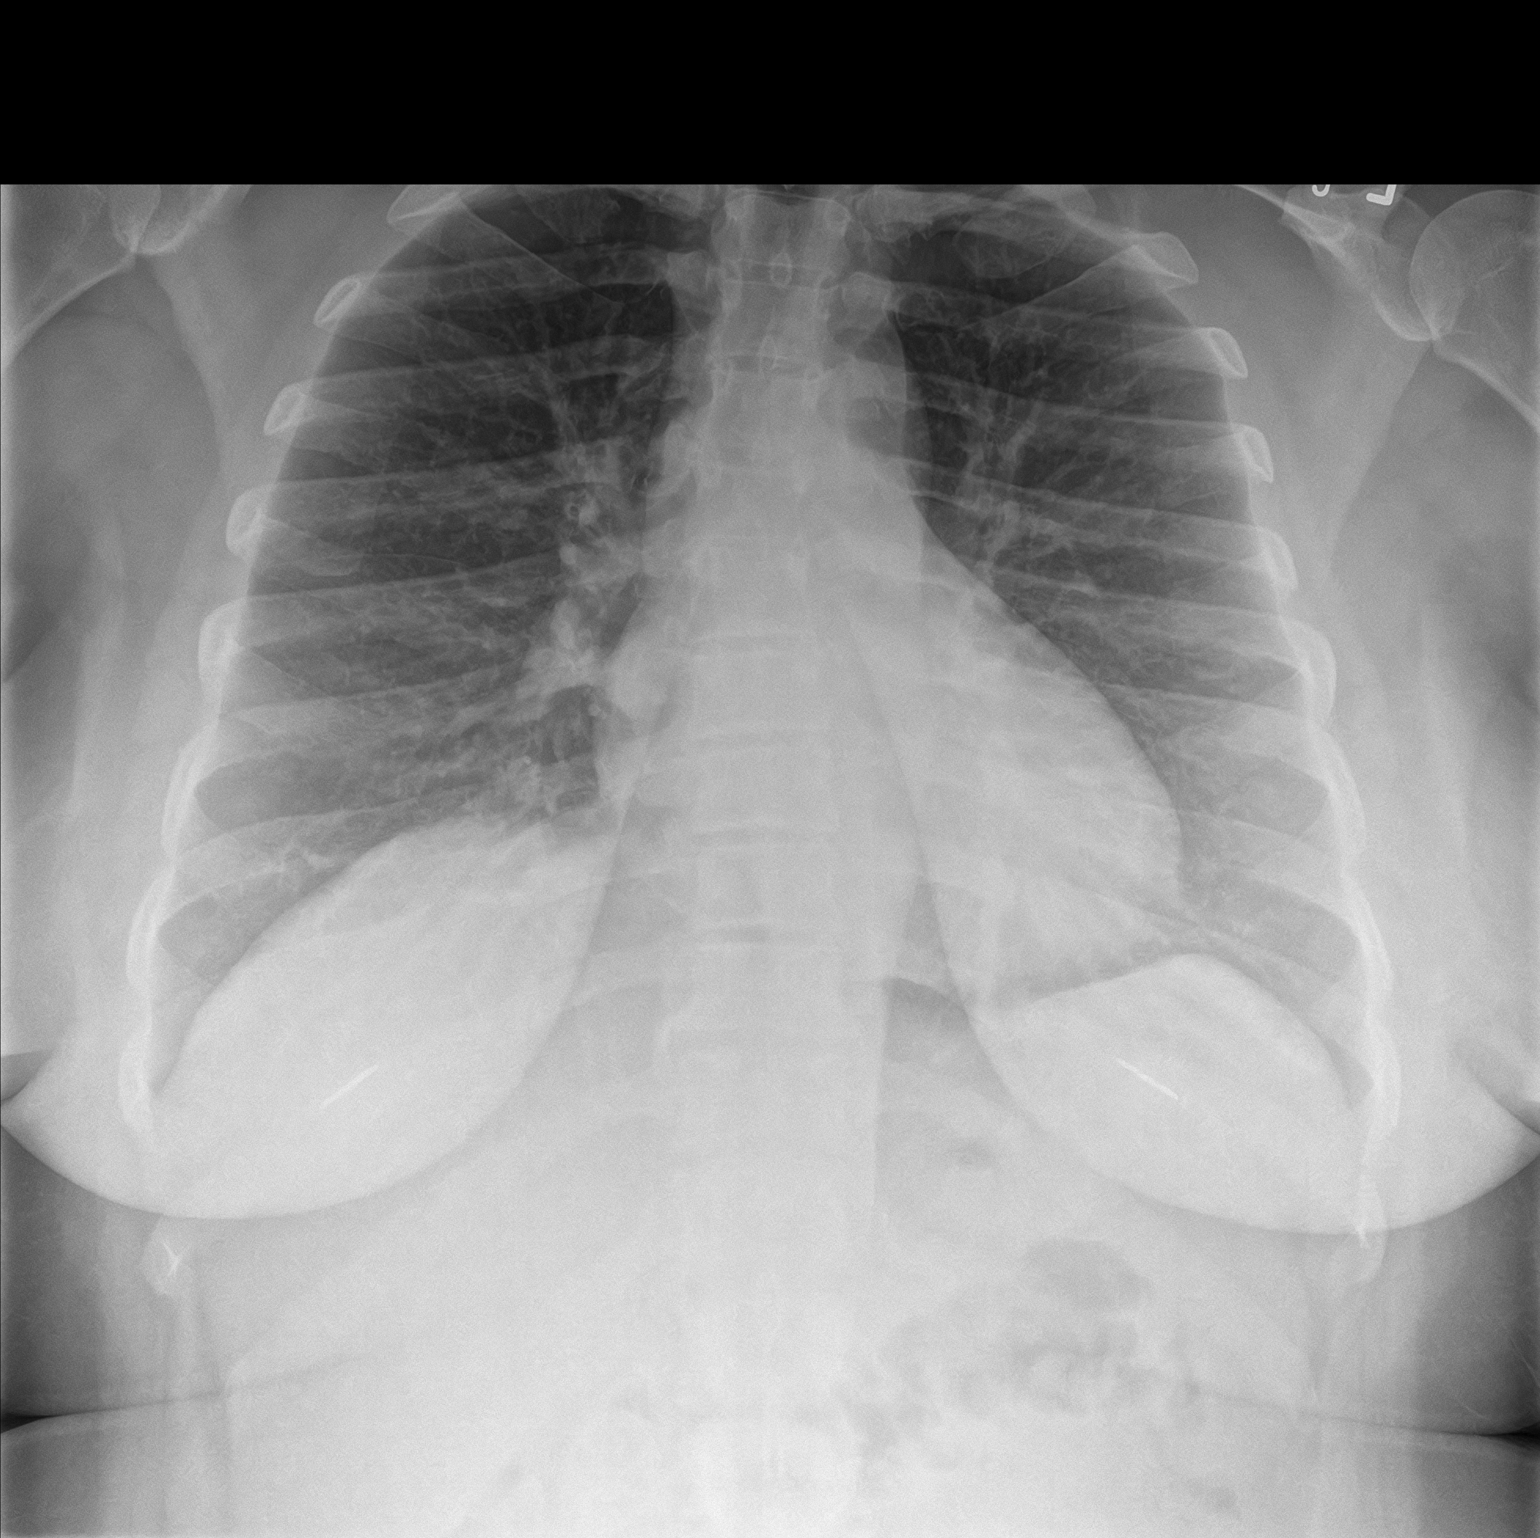
[im 2/2]
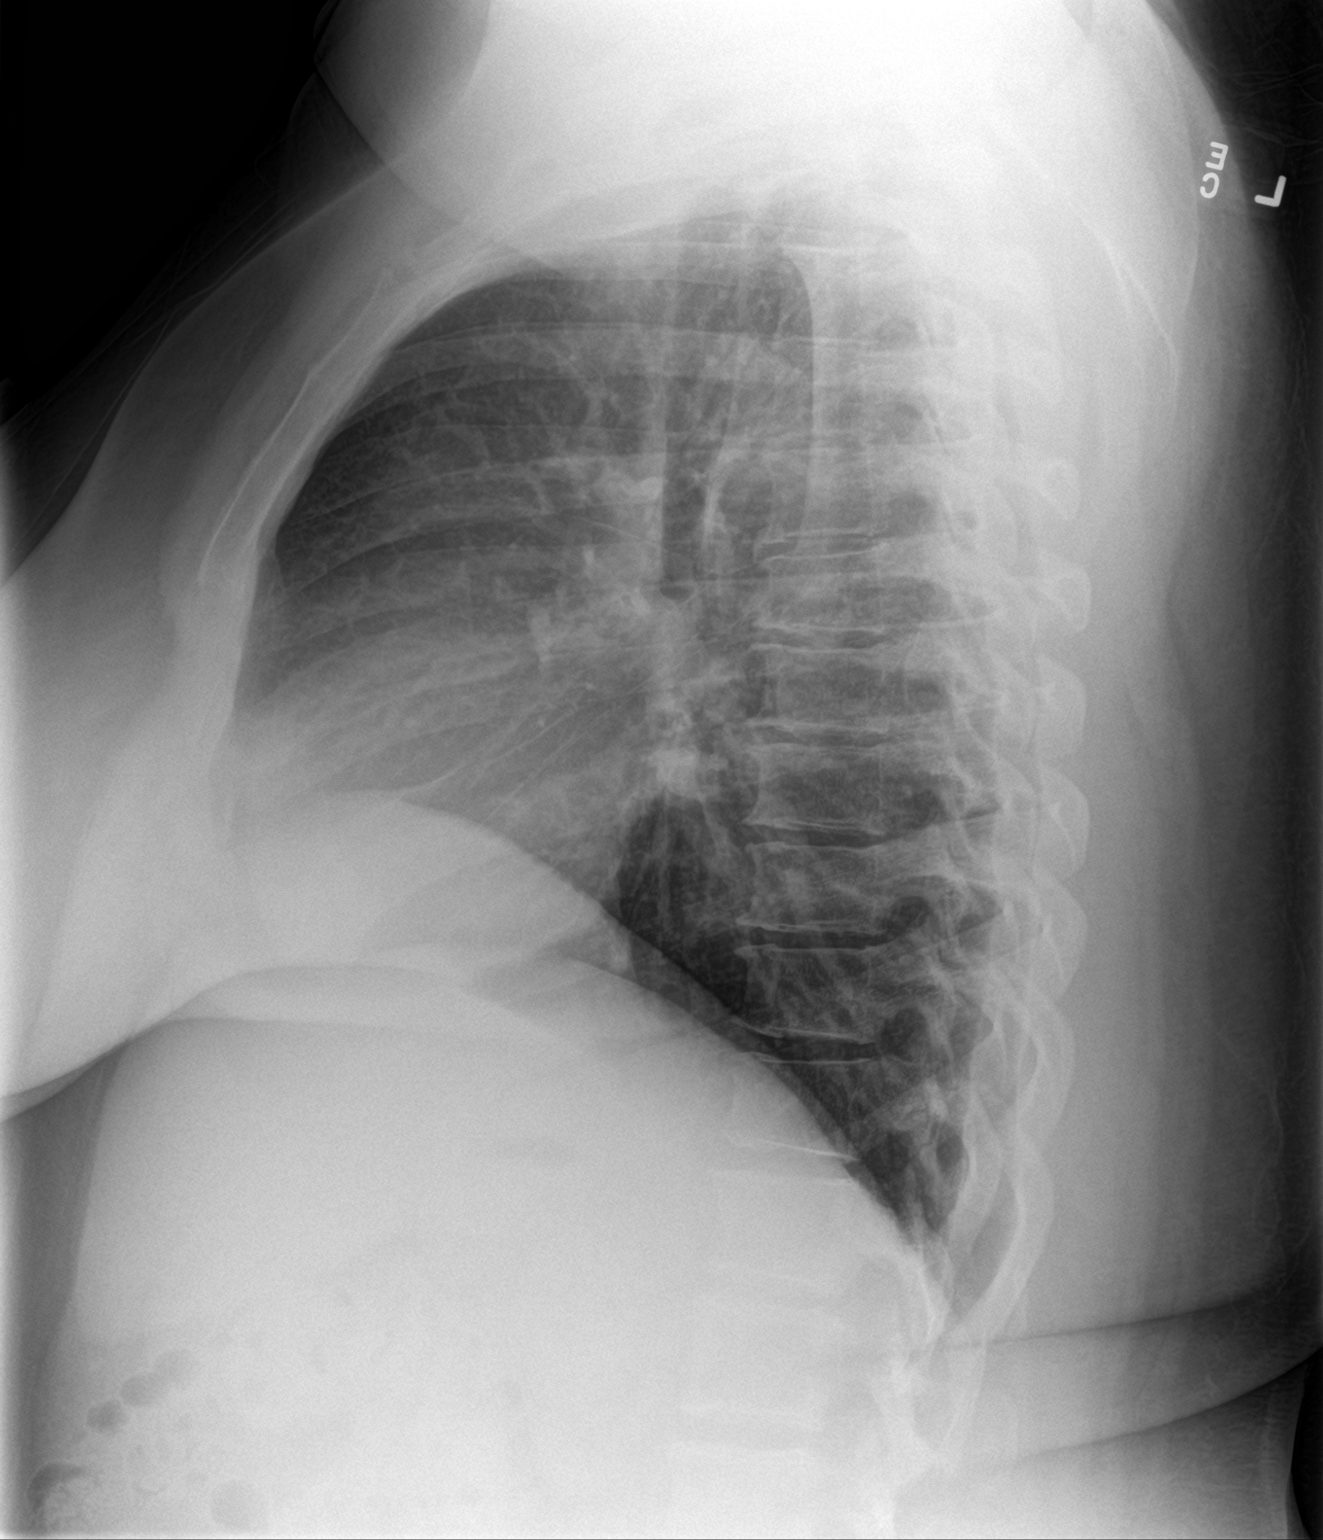

[2 of 2 positions shown; findings below may reference images not displayed]

FINDINGS: The cardiac and mediastinal silhouettes are stable in size and
contour, and remain within normal limits.

The lungs are normally inflated. No airspace consolidation, pleural
effusion, or pulmonary edema. No pneumothorax.

No acute osseous abnormality.
IMPRESSION: No radiographic evidence for active cardiopulmonary disease.
